# Patient Record
Sex: Female | Born: 1980 | Race: White | Hispanic: Yes | Smoking: Current some day smoker
Health system: Southern US, Community
[De-identification: ages and names within clinical notes are randomized; demographics above are authoritative.]

## PROBLEM LIST (undated history)

## (undated) DIAGNOSIS — D649 Anemia, unspecified: Secondary | ICD-10-CM

## (undated) DIAGNOSIS — M159 Polyosteoarthritis, unspecified: Secondary | ICD-10-CM

## (undated) DIAGNOSIS — D849 Immunodeficiency, unspecified: Secondary | ICD-10-CM

## (undated) DIAGNOSIS — N200 Calculus of kidney: Secondary | ICD-10-CM

## (undated) DIAGNOSIS — J45909 Unspecified asthma, uncomplicated: Secondary | ICD-10-CM

## (undated) DIAGNOSIS — K739 Chronic hepatitis, unspecified: Secondary | ICD-10-CM

## (undated) DIAGNOSIS — J40 Bronchitis, not specified as acute or chronic: Secondary | ICD-10-CM

## (undated) DIAGNOSIS — G43909 Migraine, unspecified, not intractable, without status migrainosus: Secondary | ICD-10-CM

## (undated) DIAGNOSIS — F603 Borderline personality disorder: Secondary | ICD-10-CM

## (undated) HISTORY — PX: OVARIAN CYST REMOVAL: SHX89

## (undated) HISTORY — DX: Anemia, unspecified: D64.9

## (undated) HISTORY — PX: ABDOMINAL HYSTERECTOMY: SHX81

## (undated) HISTORY — PX: LITHOTRIPSY: SUR834

---

## 2008-03-30 ENCOUNTER — Inpatient Hospital Stay (HOSPITAL_COMMUNITY): Admission: EM | Admit: 2008-03-30 | Discharge: 2008-04-01 | Payer: Self-pay | Admitting: Emergency Medicine

## 2008-06-20 ENCOUNTER — Emergency Department: Payer: Self-pay | Admitting: Emergency Medicine

## 2008-07-20 ENCOUNTER — Emergency Department: Payer: Self-pay | Admitting: Emergency Medicine

## 2010-09-28 NOTE — H&P (Signed)
Abigail Cunningham, Abigail Cunningham           ACCOUNT NO.:  [id number]   MEDICAL RECORD NO.:  [id number]          PATIENT TYPE:  INP   LOCATION:  3029                         FACILITY:  [facility]   PHYSICIAN:  Della Goo, M.D. DATE OF BIRTH:  1980-10-01   DATE OF ADMISSION:  03/29/2008  DATE OF DISCHARGE:                              HISTORY & PHYSICAL   DATE OF ADMISSION:  March 29, 2008.   PRIMARY CARE PHYSICIAN:  Unassigned.   CHIEF COMPLAINT:  Right-sided back pain, fever.   HISTORY OF PRESENT ILLNESS:  This is a 30 year old female who presents  to the emergency department with complaints of severe right-sided back  pain along with fever.  She reports her symptoms beginning 3 days ago.  She reports having nausea and dysuria.  She denies having any vomiting,  cough or congestion.  She denies having any diarrhea.   The patient was evaluated in the emergency department, had admission  laboratory studies performed and a urinalysis was performed which  revealed positive nitrites and large leukocyte esterase, along with  moderate hemoglobin.  The patient was referred for admission and started  on antibiotic therapy of ciprofloxacin and Rocephin, and IV fluids.   PAST MEDICAL HISTORY:  Significant for:  1. Nephrolithiasis.  2. Bipolar disorder.  3. Hepatitis C.  4. An ovarian cyst status post ovarian cyst excision which was      performed laparoscopically.   MEDICATIONS:  At this time none.   ALLERGIES:  TO HALDOL.   SOCIAL HISTORY:  The patient is a smoker,.  Reports smoking one pack of  cigarettes daily.  She denies any alcohol or drug usage.   FAMILY HISTORY:  Is noncontributory.   REVIEW OF SYSTEMS:  Pertinents are mentioned above.   PHYSICAL EXAMINATION:  30 year old well-nourished, well-developed female  in discomfort and acute distress initially.  VITAL SIGNS: Temperature 103.1, blood pressure 118/75, heart rate 150,  respirations 22, O2 saturations 98-99% on room  air.  HEENT EXAMINATION:  Normocephalic, atraumatic.  Pupils equally round,  reactive to light.  Extraocular movements are intact .  Funduscopic  benign.  There is no scleral icterus.  Oropharynx is clear.  NECK:  Is supple, full range of motion.  No thyromegaly, adenopathy,  jugulovenous distention.  CARDIOVASCULAR:  Tachycardiac rate and rhythm.  In sinus tachycardia on  the monitor.  LUNGS:  Clear to auscultation bilaterally.  ABDOMEN: Positive bowel sounds, soft, mildly tender in the suprapubic  along with positive right-sided costovertebral angle tenderness.  There  is no rebound, no guarding.  EXTREMITIES: Without cyanosis, clubbing or edema.  NEUROLOGIC EXAMINATION:  Nonfocal.   LABORATORY STUDIES:  White blood cell count 11.9, hemoglobin 14.5,  hematocrit 42.6, platelets 162, neutrophils 81% , lymphocytes 11%.  Sodium 138, potassium 3.6, chloride 104, bicarb 22, BUN 6, creatinine  0.9, glucose 130.  Urine HCG negative.  Urinalysis as mentioned above.  Positive moderate hemoglobin, positive urine nitrites and large  leukocyte esterase.  Chest x-ray reveals no acute disease process.   ASSESSMENT:  30 year old female being admitted with:  1. Sepsis.  2. Pyelonephritis.  3. Trichomonas vaginitis.   PLAN:  The patient will be admitted and will continue on antibiotic  therapy of IV ciprofloxacin and Rocephin along with IV fluids for fluid  resuscitation.  Flagyl therapy will be started and these findings will  be discussed with the patient and her sexual partner or partners will  need to be treated.  Further workup will ensue pending results of the  patient's clinical course and her clinical studies.      Della Goo, M.D.  Electronically Signed     HJ/MEDQ  D:  03/30/2008  T:  03/30/2008  Job:  [id number]

## 2010-09-28 NOTE — Discharge Summary (Signed)
Abigail Cunningham, Abigail Cunningham           ACCOUNT NO.:  [id number]   MEDICAL RECORD NO.:  [id number]          PATIENT TYPE:  INP   LOCATION:  3029                         FACILITY:  [facility]   PHYSICIAN:  Altha Harm, MDDATE OF BIRTH:  1980/10/13   DATE OF ADMISSION:  03/30/2008  DATE OF DISCHARGE:  04/01/2008                               DISCHARGE SUMMARY   DISCHARGE DISPOSITION:  Home.   FINAL DISCHARGE DIAGNOSES:  1. Acute pyelonephritis.  2. Escherichia coli.  3. Mild dehydration, resolved.  4. Allergic reaction to Dilaudid.   DISCHARGE MEDICATIONS:  Ciprofloxacin 500 mg p.o. b.i.d. x10 days.   CONSULTANTS:  None.   PROCEDURES:  None.   DIAGNOSTIC STUDIES:  1. Renal ultrasound done on March 30, 2008 which showed no evidence      of hydronephrosis and no focal cystic change in either kidney to      suggest intrarenal disease.  2. Chest x-ray two-view which shows no active disease.   CODE STATUS:  Full code.   ALLERGIES:  1. HALDOL.  2. HYDROMORPHONE.   CHIEF COMPLAINT:  Right-sided back pain and fever.   HISTORY OF PRESENT ILLNESS:  Please see the H&P dictated by Dr. Lovell Sheehan  for details of the HPI.   HOSPITAL COURSE:  The patient was admitted and placed on Rocephin and  ceftriaxone.  The patient was given supportive care and IV fluids.   The patient initially had very high fevers.  However, the infection was  treated and the fevers resolved.  The sensitivities to the urine culture  proved to be E-coli sensitive to ciprofloxacin, and the patient is being  sent home on oral ciprofloxacin for a total of 10 days to complete a  total of 14 days of antibiotics.   DIETARY RESTRICTIONS:  None.   PHYSICAL EXAMINATION:  None.   The patient is encouraged to increase her p.o. fluid intake, and the  patient can follow up with her primary care for physician as needed.      Altha Harm, MD  Electronically Signed     MAM/MEDQ  D:  04/01/2008  T:   04/01/2008  Job:  [id number]

## 2010-10-15 ENCOUNTER — Emergency Department: Payer: Self-pay | Admitting: Emergency Medicine

## 2011-02-16 LAB — CBC
MCHC: 34.8
MCHC: 36
MCV: 91.2
MCV: 93.8
Platelets: 162
RBC: 3.68 — ABNORMAL LOW
RBC: 3.75 — ABNORMAL LOW
RBC: 4.55
RDW: 11.9
RDW: 12.3
WBC: 11.9 — ABNORMAL HIGH
WBC: 5.9

## 2011-02-16 LAB — DIFFERENTIAL
Basophils Absolute: 0
Basophils Relative: 1
Eosinophils Absolute: 0
Lymphs Abs: 1.2
Lymphs Abs: 1.3
Monocytes Absolute: 0.8
Neutro Abs: 3.8
Neutrophils Relative %: 65

## 2011-02-16 LAB — URINE MICROSCOPIC-ADD ON

## 2011-02-16 LAB — POCT I-STAT, CHEM 8
Calcium, Ion: 1.14
Creatinine, Ser: 0.9
Glucose, Bld: 130 — ABNORMAL HIGH
Potassium: 3.6
Sodium: 138
TCO2: 22

## 2011-02-16 LAB — CULTURE, BLOOD (ROUTINE X 2): Culture: NO GROWTH

## 2011-02-16 LAB — COMPREHENSIVE METABOLIC PANEL
Albumin: 3.1 — ABNORMAL LOW
Alkaline Phosphatase: 46
BUN: 4 — ABNORMAL LOW
Calcium: 8.2 — ABNORMAL LOW
Chloride: 106
GFR calc Af Amer: >60
GFR calc non Af Amer: >60
Glucose, Bld: 138 — ABNORMAL HIGH

## 2011-02-16 LAB — URINALYSIS, ROUTINE W REFLEX MICROSCOPIC
Ketones, ur: NEGATIVE
Nitrite: POSITIVE — AB
Protein, ur: 30 — AB
Specific Gravity, Urine: 1.018

## 2011-02-16 LAB — URINE CULTURE

## 2011-02-16 LAB — POCT PREGNANCY, URINE: Preg Test, Ur: NEGATIVE

## 2011-05-14 ENCOUNTER — Emergency Department: Payer: Self-pay | Admitting: Emergency Medicine

## 2011-08-20 ENCOUNTER — Emergency Department: Payer: Self-pay | Admitting: Emergency Medicine

## 2011-09-26 ENCOUNTER — Emergency Department: Payer: Self-pay | Admitting: Emergency Medicine

## 2011-09-26 LAB — BASIC METABOLIC PANEL
Anion Gap: 5 — ABNORMAL LOW (ref 7–16)
BUN: 10 mg/dL (ref 7–18)
EGFR (Non-African Amer.): >60
Glucose: 99 mg/dL (ref 65–99)
Osmolality: 277 (ref 275–301)
Potassium: 4 mmol/L (ref 3.5–5.1)
Sodium: 139 mmol/L (ref 136–145)

## 2011-09-26 LAB — CBC: RDW: 12.6 % (ref 11.5–14.5)

## 2012-02-05 ENCOUNTER — Emergency Department: Payer: Self-pay | Admitting: Emergency Medicine

## 2012-02-15 ENCOUNTER — Emergency Department: Payer: Self-pay | Admitting: Unknown Physician Specialty

## 2012-02-16 ENCOUNTER — Emergency Department: Payer: Self-pay | Admitting: Emergency Medicine

## 2012-07-04 ENCOUNTER — Emergency Department: Payer: Self-pay | Admitting: Emergency Medicine

## 2012-07-04 LAB — CBC
HCT: 46.9 % (ref 35.0–47.0)
MCHC: 34.2 g/dL (ref 32.0–36.0)
WBC: 8.9 10*3/uL (ref 3.6–11.0)

## 2012-07-04 LAB — COMPREHENSIVE METABOLIC PANEL
BUN: 9 mg/dL (ref 7–18)
Bilirubin,Total: 0.6 mg/dL (ref 0.2–1.0)
Calcium, Total: 8.9 mg/dL (ref 8.5–10.1)
Chloride: 108 mmol/L — ABNORMAL HIGH (ref 98–107)
Co2: 26 mmol/L (ref 21–32)
Creatinine: 0.73 mg/dL (ref 0.60–1.30)
EGFR (African American): >60
Glucose: 92 mg/dL (ref 65–99)
Osmolality: 274 (ref 275–301)
Potassium: 4 mmol/L (ref 3.5–5.1)
SGOT(AST): 93 U/L — ABNORMAL HIGH (ref 15–37)

## 2012-07-04 LAB — URINALYSIS, COMPLETE
Nitrite: NEGATIVE
Ph: 6 (ref 4.5–8.0)
Protein: NEGATIVE
Squamous Epithelial: 2
WBC UR: 19 /HPF (ref 0–5)

## 2012-08-19 ENCOUNTER — Emergency Department: Payer: Self-pay | Admitting: Emergency Medicine

## 2012-08-19 LAB — CBC
HCT: 41.4 % (ref 35.0–47.0)
HGB: 14.5 g/dL (ref 12.0–16.0)
MCHC: 35 g/dL (ref 32.0–36.0)
MCV: 94 fL (ref 80–100)
Platelet: 141 10*3/uL — ABNORMAL LOW (ref 150–440)
RBC: 4.42 10*6/uL (ref 3.80–5.20)
WBC: 8.7 10*3/uL (ref 3.6–11.0)

## 2012-08-19 LAB — COMPREHENSIVE METABOLIC PANEL
Alkaline Phosphatase: 70 U/L (ref 50–136)
Anion Gap: 8 (ref 7–16)
Calcium, Total: 8.9 mg/dL (ref 8.5–10.1)
Chloride: 108 mmol/L — ABNORMAL HIGH (ref 98–107)
SGPT (ALT): 101 U/L — ABNORMAL HIGH (ref 12–78)
Total Protein: 7.9 g/dL (ref 6.4–8.2)

## 2012-08-19 LAB — URINALYSIS, COMPLETE
Bilirubin,UR: NEGATIVE
Hyaline Cast: 2
Ketone: NEGATIVE
Ph: 6 (ref 4.5–8.0)
RBC,UR: 45 /HPF (ref 0–5)
Squamous Epithelial: 42

## 2012-08-21 LAB — URINE CULTURE

## 2013-01-07 ENCOUNTER — Emergency Department: Payer: Self-pay | Admitting: Emergency Medicine

## 2013-01-07 LAB — CBC
MCHC: 35.5 g/dL (ref 32.0–36.0)
RBC: 4.6 10*6/uL (ref 3.80–5.20)
RDW: 12.5 % (ref 11.5–14.5)

## 2013-01-07 LAB — BASIC METABOLIC PANEL
Anion Gap: 6 — ABNORMAL LOW (ref 7–16)
BUN: 9 mg/dL (ref 7–18)
Chloride: 107 mmol/L (ref 98–107)
Creatinine: 0.71 mg/dL (ref 0.60–1.30)
EGFR (African American): >60
EGFR (Non-African Amer.): >60
Osmolality: 272 (ref 275–301)
Sodium: 137 mmol/L (ref 136–145)

## 2013-05-29 ENCOUNTER — Emergency Department: Payer: Self-pay | Admitting: Emergency Medicine

## 2014-04-26 ENCOUNTER — Emergency Department: Payer: Self-pay | Admitting: Internal Medicine

## 2014-10-23 ENCOUNTER — Encounter: Payer: Self-pay | Admitting: Emergency Medicine

## 2014-10-23 ENCOUNTER — Emergency Department
Admission: EM | Admit: 2014-10-23 | Discharge: 2014-10-23 | Disposition: A | Discharge status: Home / Self Care | Payer: 59 | Attending: Emergency Medicine | Admitting: Emergency Medicine

## 2014-10-23 DIAGNOSIS — M5410 Radiculopathy, site unspecified: Secondary | ICD-10-CM | POA: Diagnosis not present

## 2014-10-23 DIAGNOSIS — Z87891 Personal history of nicotine dependence: Secondary | ICD-10-CM | POA: Insufficient documentation

## 2014-10-23 DIAGNOSIS — M546 Pain in thoracic spine: Secondary | ICD-10-CM

## 2014-10-23 DIAGNOSIS — M25512 Pain in left shoulder: Secondary | ICD-10-CM | POA: Diagnosis present

## 2014-10-23 HISTORY — DX: Bronchitis, not specified as acute or chronic: J40

## 2014-10-23 HISTORY — DX: Immunodeficiency, unspecified: D84.9

## 2014-10-23 MED ORDER — HYDROCODONE-ACETAMINOPHEN 5-325 MG PO TABS: 1.0000 | ORAL_TABLET | ORAL | Status: DC | PRN

## 2014-10-23 MED ORDER — MELOXICAM 15 MG PO TABS: 15.0000 mg | ORAL_TABLET | Freq: Every day | ORAL | Status: DC

## 2014-10-23 MED ORDER — PREDNISONE 10 MG (21) PO TBPK: ORAL_TABLET | ORAL | Status: DC

## 2014-10-23 NOTE — ED Notes (Signed)
NAD noted at time of D/C. Pt denies questions or concerns. Pt ambulatory to the lobby at this time.  

## 2014-10-23 NOTE — ED Provider Notes (Signed)
[facility] Emergency Department Provider Note  ____________________________________________  Time seen: Approximately 2:48 PM  I have reviewed the triage vital signs and the nursing notes.   HISTORY  Chief Complaint Arm Pain  HPI Abigail Cunningham is a 34 y.o. female who presents to the emergency department for pain in the left scapular area. She states that the pain comes and goes but recently she believes it is causing her fingers become numb and tingly, especially at night. She has not taken any pain medications over-the-counter.   Past Medical History  Diagnosis Date  . Bronchitis unk  . Immune deficiency disorder     There are no active problems to display for this patient.   History reviewed. No pertinent past surgical history.  Current Outpatient Rx  Name  Route  Sig  Dispense  Refill  . HYDROcodone-acetaminophen (NORCO/VICODIN) 5-325 MG per tablet   Oral   Take 1 tablet by mouth every 4 (four) hours as needed for moderate pain.   12 tablet   0   . meloxicam (MOBIC) 15 MG tablet   Oral   Take 1 tablet (15 mg total) by mouth daily.   30 tablet   2   . predniSONE (STERAPRED UNI-PAK 21 TAB) 10 MG (21) TBPK tablet      Take 6 tablets on day 1 Take 5 tablets on day 2 Take 4 tablets on day 3 Take 3 tablets on day 4 Take 2 tablets on day 5 Take 1 tablet on day 6   21 tablet   0     Allergies Haldol  History reviewed. No pertinent family history.  Social History History  Substance Use Topics  . Smoking status: Former Research scientist (life sciences)  . Smokeless tobacco: Not on file  . Alcohol Use: No    Review of Systems Constitutional: No recent illness. Eyes: No visual changes. ENT: No sore throat. Cardiovascular: Denies chest pain or palpitations. Respiratory: Denies shortness of breath. Gastrointestinal: No abdominal pain.  Genitourinary: Negative for dysuria. Musculoskeletal: Pain in left scapula with radiation into the left thumb and  index finger Skin: Negative for rash. Neurological: Negative for headaches, focal weakness or numbness. 10-point ROS otherwise negative.  ____________________________________________   PHYSICAL EXAM:  VITAL SIGNS: ED Triage Vitals  Enc Vitals Group     BP 10/23/14 1311 137/93 mmHg     Pulse Rate 10/23/14 1311 78     Resp 10/23/14 1311 20     Temp 10/23/14 1311 98 F (36.7 C)     Temp Source 10/23/14 1311 Oral     SpO2 10/23/14 1311 99 %     Weight 10/23/14 1311 185 lb (83.915 kg)     Height 10/23/14 1311 5\' 3"  (1.6 m)     Head Cir --      Peak Flow --      Pain Score 10/23/14 1312 8     Pain Loc --      Pain Edu? --      Excl. in [place]? --     Constitutional: Alert and oriented. Well appearing and in no acute distress. Eyes: Conjunctivae are normal. EOMI. Head: Atraumatic. Nose: No congestion/rhinnorhea. Neck: No stridor.  Respiratory: Normal respiratory effort.   Musculoskeletal: Tenderness over left scapula. Full range of motion of left shoulder. Neurologic:  Normal speech and language. No gross focal neurologic deficits are appreciated. Speech is normal. No gait instability. Skin:  Skin is warm, dry and intact. Atraumatic. Psychiatric: Mood and affect are normal.  Speech and behavior are normal.  ____________________________________________   LABS (all labs ordered are listed, but only abnormal results are displayed)  Labs Reviewed - No data to display ____________________________________________  RADIOLOGY  Not indicated ____________________________________________   PROCEDURES  Procedure(s) performed: None   ____________________________________________   INITIAL IMPRESSION / ASSESSMENT AND PLAN / ED COURSE  Pertinent labs & imaging results that were available during my care of the patient were reviewed by me and considered in my medical decision making (see chart for details).  Patient was advised not to sleep with her arm above her head. She was  advised to follow-up with primary care or orthopedics for symptoms that are not improving with medication. She was advised to return to the emergency department for symptoms that change or worsen if she is unable schedule an appointment. ____________________________________________   FINAL CLINICAL IMPRESSION(S) / ED DIAGNOSES  Final diagnoses:  Radiculopathy affecting upper extremity  Left-sided thoracic back pain      Victorino Dike, FNP 10/23/14 1730  Lavonia Drafts, MD 10/25/14 [phone]

## 2014-10-23 NOTE — ED Notes (Signed)
States she is having pain to left shoulder for the past 3 days . Does a lot of lifting with same arm..has been seen for same and given valium,  Ran out of meds

## 2015-02-07 ENCOUNTER — Encounter: Payer: Self-pay | Admitting: *Deleted

## 2015-02-07 ENCOUNTER — Emergency Department
Admission: EM | Admit: 2015-02-07 | Discharge: 2015-02-07 | Discharge status: Against Medical Advice | Payer: 59 | Attending: Emergency Medicine | Admitting: Emergency Medicine

## 2015-02-07 DIAGNOSIS — M25511 Pain in right shoulder: Secondary | ICD-10-CM | POA: Diagnosis not present

## 2015-02-07 DIAGNOSIS — Z87891 Personal history of nicotine dependence: Secondary | ICD-10-CM | POA: Insufficient documentation

## 2015-02-07 NOTE — ED Notes (Signed)
Pt reports right shoulder pain, sudden onset when mowing tonight. Radiates from shoulder down arm.

## 2015-07-15 ENCOUNTER — Emergency Department
Admission: EM | Admit: 2015-07-15 | Discharge: 2015-07-15 | Disposition: A | Discharge status: Home / Self Care | Payer: 59 | Attending: Emergency Medicine | Admitting: Emergency Medicine

## 2015-07-15 DIAGNOSIS — R059 Cough, unspecified: Secondary | ICD-10-CM

## 2015-07-15 DIAGNOSIS — R05 Cough: Secondary | ICD-10-CM

## 2015-07-15 DIAGNOSIS — Z791 Long term (current) use of non-steroidal anti-inflammatories (NSAID): Secondary | ICD-10-CM | POA: Insufficient documentation

## 2015-07-15 DIAGNOSIS — B349 Viral infection, unspecified: Secondary | ICD-10-CM | POA: Diagnosis not present

## 2015-07-15 DIAGNOSIS — Z87891 Personal history of nicotine dependence: Secondary | ICD-10-CM | POA: Diagnosis not present

## 2015-07-15 MED ORDER — HYDROCOD POLST-CPM POLST ER 10-8 MG/5ML PO SUER
5.0000 mL | Freq: Once | ORAL | Status: AC
  Administered 2015-07-15: 5 mL via ORAL
  Filled 2015-07-15: qty 5

## 2015-07-15 MED ORDER — HYDROCOD POLST-CPM POLST ER 10-8 MG/5ML PO SUER: 5.0000 mL | Freq: Two times a day (BID) | ORAL | Status: DC | PRN

## 2015-07-15 NOTE — ED Provider Notes (Signed)
[facility] Emergency Department Provider Note  ____________________________________________   I have reviewed the triage vital signs and the nursing notes.   HISTORY  Chief Complaint Cough    HPI Abigail Cunningham is a 35 y.o. female who is healthy, no history of COPD. Is a smoker. Has had bronchitis in the past. Has an allergy to Haldol. Patient states that she has had a dry cough for 2 days, starting yesterday, as well as loose stools. Her husband had the same symptoms. He does smoke. She states she is down to one cigarette a day. She denies any vomiting or nausea. She states her whole body hurts. She wants something for pain from coughing. She denies any significant shortness of breath. She denies productive cough. She denies wheeze. Nothing makes the pain better, coughing makes it worse. She denies a melena or bright red blood per rectum. The pain is an aching pain.     Past Medical History  Diagnosis Date  . Bronchitis unk  . Immune deficiency disorder     There are no active problems to display for this patient.   Past Surgical History  Procedure Laterality Date  . Abdominal hysterectomy    . Ovarian cyst removal      Current Outpatient Rx  Name  Route  Sig  Dispense  Refill  . HYDROcodone-acetaminophen (NORCO/VICODIN) 5-325 MG per tablet   Oral   Take 1 tablet by mouth every 4 (four) hours as needed for moderate pain.   12 tablet   0   . meloxicam (MOBIC) 15 MG tablet   Oral   Take 1 tablet (15 mg total) by mouth daily.   30 tablet   2   . predniSONE (STERAPRED UNI-PAK 21 TAB) 10 MG (21) TBPK tablet      Take 6 tablets on day 1 Take 5 tablets on day 2 Take 4 tablets on day 3 Take 3 tablets on day 4 Take 2 tablets on day 5 Take 1 tablet on day 6   21 tablet   0     Allergies Haldol  No family history on file.  Social History Social History  Substance Use Topics  . Smoking status: Former Research scientist (life sciences)  . Smokeless tobacco:  Not on file  . Alcohol Use: No    Review of Systems Constitutional: No fever/chills Eyes: No visual changes. ENT: No sore throat. No stiff neck no neck pain Cardiovascular: Denies chest pain. Respiratory: Denies shortness of breath. Gastrointestinal:   no vomiting.  Positive loose stools.  No constipation. Genitourinary: Negative for dysuria. Musculoskeletal: Negative lower extremity swelling Skin: Negative for rash. Neurological: Negative for headaches, focal weakness or numbness. 10-point ROS otherwise negative.  ____________________________________________   PHYSICAL EXAM:  VITAL SIGNS: ED Triage Vitals  Enc Vitals Group     BP 07/15/15 0543 132/71 mmHg     Pulse Rate 07/15/15 0543 92     Resp 07/15/15 0543 18     Temp 07/15/15 0543 98.4 F (36.9 C)     Temp Source 07/15/15 0543 Oral     SpO2 07/15/15 0543 99 %     Weight 07/15/15 0543 175 lb (79.379 kg)     Height 07/15/15 0543 5\' 3"  (1.6 m)     Head Cir --      Peak Flow --      Pain Score 07/15/15 0543 5     Pain Loc --      Pain Edu? --  Excl. in [place]? --     Constitutional: Alert and oriented. Well appearing and in no acute distress. Eyes: Conjunctivae are normal. PERRL. EOMI. Head: Atraumatic. Nose: No congestion/rhinnorhea. Mouth/Throat: Mucous membranes are moist.  Oropharynx non-erythematous. Neck: No stridor.   Nontender with no meningismus Cardiovascular: Normal rate, regular rhythm. Grossly normal heart sounds.  Good peripheral circulation. Respiratory: Normal respiratory effort.  No retractions. Lungs CTAB. Abdominal: Soft and nontender. No distention. No guarding no rebound Back:  There is no focal tenderness or step off there is no midline tenderness there are no lesions noted. there is no CVA tenderness Musculoskeletal: No lower extremity tenderness. No joint effusions, no DVT signs strong distal pulses no edema Neurologic:  Normal speech and language. No gross focal neurologic deficits are  appreciated.  Skin:  Skin is warm, dry and intact. No rash noted. Psychiatric: Mood and affect are normal. Speech and behavior are normal.  ____________________________________________   LABS (all labs ordered are listed, but only abnormal results are displayed)  Labs Reviewed - No data to display ____________________________________________  EKG  I personally interpreted any EKGs ordered by me or triage  ____________________________________________  RADIOLOGY  I reviewed any imaging ordered by me or triage that were performed during my shift ____________________________________________   PROCEDURES  Procedure(s) performed: None  Critical Care performed: None  ____________________________________________   INITIAL IMPRESSION / ASSESSMENT AND PLAN / ED COURSE  Pertinent labs & imaging results that were available during my care of the patient were reviewed by me and considered in my medical decision making (see chart for details). very well-appearing person with a viral syndrome. No evidence at this time of pneumonia. Sats are 99-100% lungs are clearincreased respiratory work of breathing. No evidence of pneumonia. Do not that she has CAD do not think that she has any evidence of bacterial pathology. Given diarrhea, empiric antibiotics would be certainly not in her best interest. We will treat her supportively. Patient states  her biggest concern at this time as her "whole body pain and her cough. We will send her home with Tussionex which should help both. Return precautions and follow-up given and understood.  ____________________________________________   FINAL CLINICAL IMPRESSION(S) / ED DIAGNOSES  Final diagnoses:  None      This chart was dictated using voice recognition software.  Despite best efforts to proofread,  errors can occur which can change meaning.     Schuyler Amor, MD 07/15/15 [phone]

## 2015-07-15 NOTE — ED Notes (Signed)
Pt reports 2 days of "dry" cough and wheezing, pt states " Its my bronchitis flaring up". Pt in no acute distress.

## 2015-07-15 NOTE — ED Notes (Signed)
Pt given work note as requested, leaving with spouse.

## 2015-07-15 NOTE — ED Notes (Signed)
Pt in with co cough x 2 days, now having pain all over.  Denies any fever at this time hx of bronchitis, states feels the same.

## 2015-07-15 NOTE — ED Notes (Signed)
Discussed discharge instructions, prescriptions, and follow-up care with patient. No questions or concerns at this time. Pt stable at discharge.  

## 2015-07-15 NOTE — Discharge Instructions (Signed)

## 2016-04-22 ENCOUNTER — Emergency Department
Admission: EM | Admit: 2016-04-22 | Discharge: 2016-04-22 | Disposition: A | Discharge status: Home / Self Care | Payer: 59 | Attending: Emergency Medicine | Admitting: Emergency Medicine

## 2016-04-22 ENCOUNTER — Encounter: Payer: Self-pay | Admitting: Emergency Medicine

## 2016-04-22 DIAGNOSIS — R197 Diarrhea, unspecified: Secondary | ICD-10-CM | POA: Insufficient documentation

## 2016-04-22 DIAGNOSIS — F17200 Nicotine dependence, unspecified, uncomplicated: Secondary | ICD-10-CM | POA: Insufficient documentation

## 2016-04-22 DIAGNOSIS — N390 Urinary tract infection, site not specified: Secondary | ICD-10-CM

## 2016-04-22 DIAGNOSIS — R52 Pain, unspecified: Secondary | ICD-10-CM | POA: Diagnosis present

## 2016-04-22 DIAGNOSIS — R252 Cramp and spasm: Secondary | ICD-10-CM

## 2016-04-22 LAB — CBC WITH DIFFERENTIAL/PLATELET
BASOS PCT: 1 %
Basophils Absolute: 0 10*3/uL (ref 0–0.1)
EOS ABS: 0.2 10*3/uL (ref 0–0.7)
Eosinophils Relative: 4 %
HCT: 38 % (ref 35.0–47.0)
HEMOGLOBIN: 13.7 g/dL (ref 12.0–16.0)
Lymphocytes Relative: 36 %
Lymphs Abs: 2.1 10*3/uL (ref 1.0–3.6)
MCH: 32.4 pg (ref 26.0–34.0)
MCHC: 35.9 g/dL (ref 32.0–36.0)
MCV: 90.3 fL (ref 80.0–100.0)
MONOS PCT: 8 %
Monocytes Absolute: 0.5 10*3/uL (ref 0.2–0.9)
NEUTROS PCT: 51 %
Neutro Abs: 3 10*3/uL (ref 1.4–6.5)
Platelets: 139 10*3/uL — ABNORMAL LOW (ref 150–440)
RBC: 4.22 MIL/uL (ref 3.80–5.20)
RDW: 12.9 % (ref 11.5–14.5)
WBC: 5.8 10*3/uL (ref 3.6–11.0)

## 2016-04-22 LAB — URINALYSIS, ROUTINE W REFLEX MICROSCOPIC
BILIRUBIN URINE: NEGATIVE
Glucose, UA: NEGATIVE mg/dL
KETONES UR: NEGATIVE mg/dL
Nitrite: NEGATIVE
PROTEIN: NEGATIVE mg/dL
Specific Gravity, Urine: 1.006 (ref 1.005–1.030)
pH: 7 (ref 5.0–8.0)

## 2016-04-22 LAB — POCT PREGNANCY, URINE: PREG TEST UR: NEGATIVE

## 2016-04-22 LAB — COMPREHENSIVE METABOLIC PANEL
ALBUMIN: 3.9 g/dL (ref 3.5–5.0)
ALK PHOS: 60 U/L (ref 38–126)
ALT: 92 U/L — ABNORMAL HIGH (ref 14–54)
ANION GAP: 8 (ref 5–15)
AST: 64 U/L — ABNORMAL HIGH (ref 15–41)
BUN: 8 mg/dL (ref 6–20)
CALCIUM: 8.9 mg/dL (ref 8.9–10.3)
CHLORIDE: 105 mmol/L (ref 101–111)
CO2: 24 mmol/L (ref 22–32)
Creatinine, Ser: 0.7 mg/dL (ref 0.44–1.00)
GFR calc Af Amer: >60 mL/min (ref >60)
GFR calc non Af Amer: >60 mL/min (ref >60)
GLUCOSE: 121 mg/dL — AB (ref 65–99)
POTASSIUM: 3.3 mmol/L — AB (ref 3.5–5.1)
SODIUM: 137 mmol/L (ref 135–145)
Total Bilirubin: 0.3 mg/dL (ref 0.3–1.2)
Total Protein: 7.3 g/dL (ref 6.5–8.1)

## 2016-04-22 MED ORDER — CEPHALEXIN 500 MG PO CAPS: 500.0000 mg | ORAL_CAPSULE | Freq: Three times a day (TID) | ORAL | 0 refills | Status: DC

## 2016-04-22 MED ORDER — SODIUM CHLORIDE 0.9 % IV BOLUS (SEPSIS)
1000.0000 mL | Freq: Once | INTRAVENOUS | Status: AC
  Administered 2016-04-22: 1000 mL via INTRAVENOUS

## 2016-04-22 MED ORDER — KETOROLAC TROMETHAMINE 30 MG/ML IJ SOLN
30.0000 mg | Freq: Once | INTRAMUSCULAR | Status: AC
Start: 2016-04-22 — End: 2016-04-22
  Administered 2016-04-22: 30 mg via INTRAVENOUS
  Filled 2016-04-22: qty 1

## 2016-04-22 NOTE — ED Notes (Signed)
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 Pt also reports diarrhea x3 days, (+) nausea, but denies vomiting. Pt also denies any c/o CP or SHOB. Pt also with c/o feeling "achy and fell like crap".

## 2016-04-22 NOTE — ED Triage Notes (Signed)
Patient ambulatory to triage with steady gait, without difficulty or distress noted; pt reports last few days having "spasms" to left side accomp by diarrhea

## 2016-04-22 NOTE — ED Provider Notes (Signed)
[facility] Emergency Department Provider Note  Time seen: 7:13 AM  I have reviewed the triage vital signs and the nursing notes.   HISTORY  Chief Complaint No chief complaint on file.    HPI Abigail Cunningham is a 35 y.o. female who presents to the emergency department with body aches and diarrhea. According to the patient for the past 3 days she has been experiencing diarrhea several times per day. States some nausea but denies vomiting. States lack of appetite. She also states for the past several days she has been having body aches which she describes mostly in her bilateral flanks. 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 States she's had this many times before. Overall the patient appears very well, states the flank pain is minimal at this time.  Past Medical History:  Diagnosis Date  . Bronchitis unk  . Immune deficiency disorder ([place])     There are no active problems to display for this patient.   Past Surgical History:  Procedure Laterality Date  . ABDOMINAL HYSTERECTOMY    . OVARIAN CYST REMOVAL      Prior to Admission medications   Medication Sig Start Date End Date Taking? Authorizing Provider  chlorpheniramine-HYDROcodone (TUSSIONEX PENNKINETIC ER) 10-8 MG/5ML SUER Take 5 mLs by mouth every 12 (twelve) hours as needed for cough. 07/15/15   Schuyler Amor, MD  HYDROcodone-acetaminophen (NORCO/VICODIN) 5-325 MG per tablet Take 1 tablet by mouth every 4 (four) hours as needed for moderate pain. 10/23/14   Victorino Dike, FNP  meloxicam (MOBIC) 15 MG tablet Take 1 tablet (15 mg total) by mouth daily. 10/23/14   Victorino Dike, FNP  predniSONE (STERAPRED UNI-PAK 21 TAB) 10 MG (21) TBPK tablet Take 6 tablets on day 1 Take 5 tablets on day 2 Take 4 tablets on day 3 Take 3 tablets on day 4 Take 2 tablets on day 5 Take 1 tablet on day 6 10/23/14   Victorino Dike, FNP    Allergies  Allergen Reactions  . Haldol [Haloperidol Lactate]  Other (See Comments)    Shuts down 'system'    No family history on file.  Social History Social History  Substance Use Topics  . Smoking status: Current Some Day Smoker  . Smokeless tobacco: Never Used  . Alcohol use No    Review of Systems Constitutional: Negative for fever. Cardiovascular: Negative for chest pain. Respiratory: Negative for shortness of breath. Gastrointestinal: Bilateral flank pain. No abdominal pain. Positive for nausea. Negative for vomiting. Positive for diarrhea. Genitourinary: Negative for dysuria. Neurological: Negative for headache 10-point ROS otherwise negative.  ____________________________________________   PHYSICAL EXAM:  VITAL SIGNS: ED Triage Vitals [04/22/16 0639]  Enc Vitals Group     BP 125/87     Pulse Rate 76     Resp 18     Temp 98.5 F (36.9 C)     Temp Source Oral     SpO2 97 %     Weight 175 lb (79.4 kg)     Height 5\' 3"  (1.6 m)     Head Circumference      Peak Flow      Pain Score 4     Pain Loc      Pain Edu?      Excl. in [place]?     Constitutional: Alert and oriented. Well appearing and in no distress. Eyes: Normal exam ENT   Head: Normocephalic and atraumatic   Mouth/Throat: Mucous membranes  are moist. Cardiovascular: Normal rate, regular rhythm. No murmur Respiratory: Normal respiratory effort without tachypnea nor retractions. Breath sounds are clear Gastrointestinal: Soft and nontender. No distention.  Musculoskeletal: Nontender with normal range of motion in all extremities. Neurologic:  Normal speech and language. No gross focal neurologic deficits Skin:  Skin is warm, dry and intact.  Psychiatric: Mood and affect are normal.   ____________________________________________    INITIAL IMPRESSION / ASSESSMENT AND PLAN / ED COURSE  Pertinent labs & imaging results that were available during my care of the patient were reviewed by me and considered in my medical decision making (see chart for  details).  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 She is a nontender abdominal exam. No CVA tenderness. Patient also states 3 days of diarrhea, several episodes per day. Denies fever. Denies vomiting. We will check basic labs, IV hydrate and treat with a IV dose of Toradol. Patient agreeable to plan. Overall the patient appears very well.  Blood work is largely within normal limits including negative/normal white blood cell count. Urinalysis consistent with urinary tract infection. Urine culture has been added on. We will cover with Keflex. Patient will be discharge with PCP follow-up.  ____________________________________________   FINAL CLINICAL IMPRESSION(S) / ED DIAGNOSES  Muscle cramps Diarrhea UTI    Harvest Dark, MD 04/22/16 [phone]

## 2016-04-23 LAB — URINE CULTURE

## 2016-09-13 ENCOUNTER — Emergency Department
Admission: EM | Admit: 2016-09-13 | Discharge: 2016-09-13 | Disposition: A | Discharge status: Home / Self Care | Payer: 59 | Attending: Emergency Medicine | Admitting: Emergency Medicine

## 2016-09-13 ENCOUNTER — Emergency Department: Payer: 59

## 2016-09-13 ENCOUNTER — Encounter: Payer: Self-pay | Admitting: Emergency Medicine

## 2016-09-13 ENCOUNTER — Telehealth: Payer: Self-pay | Admitting: Emergency Medicine

## 2016-09-13 DIAGNOSIS — F17200 Nicotine dependence, unspecified, uncomplicated: Secondary | ICD-10-CM | POA: Insufficient documentation

## 2016-09-13 DIAGNOSIS — N83292 Other ovarian cyst, left side: Secondary | ICD-10-CM | POA: Diagnosis not present

## 2016-09-13 DIAGNOSIS — N3000 Acute cystitis without hematuria: Secondary | ICD-10-CM | POA: Diagnosis not present

## 2016-09-13 DIAGNOSIS — Z79899 Other long term (current) drug therapy: Secondary | ICD-10-CM | POA: Insufficient documentation

## 2016-09-13 DIAGNOSIS — R1031 Right lower quadrant pain: Secondary | ICD-10-CM | POA: Diagnosis present

## 2016-09-13 DIAGNOSIS — J45909 Unspecified asthma, uncomplicated: Secondary | ICD-10-CM | POA: Diagnosis not present

## 2016-09-13 DIAGNOSIS — N83202 Unspecified ovarian cyst, left side: Secondary | ICD-10-CM

## 2016-09-13 DIAGNOSIS — R1032 Left lower quadrant pain: Secondary | ICD-10-CM

## 2016-09-13 HISTORY — DX: Chronic hepatitis, unspecified: K73.9

## 2016-09-13 HISTORY — DX: Unspecified asthma, uncomplicated: J45.909

## 2016-09-13 LAB — COMPREHENSIVE METABOLIC PANEL
ALT: 71 U/L — AB (ref 14–54)
AST: 80 U/L — AB (ref 15–41)
Albumin: 4.1 g/dL (ref 3.5–5.0)
Alkaline Phosphatase: 67 U/L (ref 38–126)
Anion gap: 7 (ref 5–15)
BUN: 10 mg/dL (ref 6–20)
CHLORIDE: 106 mmol/L (ref 101–111)
CO2: 23 mmol/L (ref 22–32)
CREATININE: 0.68 mg/dL (ref 0.44–1.00)
Calcium: 9 mg/dL (ref 8.9–10.3)
GFR calc Af Amer: >60 mL/min (ref >60)
GFR calc non Af Amer: >60 mL/min (ref >60)
Glucose, Bld: 144 mg/dL — ABNORMAL HIGH (ref 65–99)
Potassium: 3.6 mmol/L (ref 3.5–5.1)
SODIUM: 136 mmol/L (ref 135–145)
Total Bilirubin: 0.6 mg/dL (ref 0.3–1.2)
Total Protein: 7.7 g/dL (ref 6.5–8.1)

## 2016-09-13 LAB — URINALYSIS, COMPLETE (UACMP) WITH MICROSCOPIC
BILIRUBIN URINE: NEGATIVE
Glucose, UA: NEGATIVE mg/dL
Ketones, ur: NEGATIVE mg/dL
Nitrite: NEGATIVE
Protein, ur: 30 mg/dL — AB
SPECIFIC GRAVITY, URINE: 1.021 (ref 1.005–1.030)
pH: 5 (ref 5.0–8.0)

## 2016-09-13 LAB — CBC
HEMATOCRIT: 43.4 % (ref 35.0–47.0)
Hemoglobin: 14.6 g/dL (ref 12.0–16.0)
MCH: 30.4 pg (ref 26.0–34.0)
MCHC: 33.7 g/dL (ref 32.0–36.0)
MCV: 90.2 fL (ref 80.0–100.0)
PLATELETS: 129 10*3/uL — AB (ref 150–440)
RBC: 4.81 MIL/uL (ref 3.80–5.20)
RDW: 14 % (ref 11.5–14.5)
WBC: 7 10*3/uL (ref 3.6–11.0)

## 2016-09-13 LAB — POCT PREGNANCY, URINE: PREG TEST UR: NEGATIVE

## 2016-09-13 LAB — LIPASE, BLOOD: LIPASE: 32 U/L (ref 11–51)

## 2016-09-13 MED ORDER — CEFTRIAXONE SODIUM-DEXTROSE 1-3.74 GM-% IV SOLR
1.0000 g | Freq: Once | INTRAVENOUS | Status: DC
  Filled 2016-09-13: qty 50

## 2016-09-13 MED ORDER — KETOROLAC TROMETHAMINE 30 MG/ML IJ SOLN
30.0000 mg | Freq: Once | INTRAMUSCULAR | Status: AC
  Administered 2016-09-13: 30 mg via INTRAVENOUS
  Filled 2016-09-13: qty 1

## 2016-09-13 MED ORDER — CEPHALEXIN 500 MG PO CAPS: 500.0000 mg | ORAL_CAPSULE | Freq: Four times a day (QID) | ORAL | 0 refills | Status: AC

## 2016-09-13 MED ORDER — ACETAMINOPHEN 500 MG PO TABS
1000.0000 mg | ORAL_TABLET | Freq: Once | ORAL | Status: DC
Start: 2016-09-13 — End: 2016-09-13
  Filled 2016-09-13: qty 2

## 2016-09-13 MED ORDER — SENNOSIDES-DOCUSATE SODIUM 8.6-50 MG PO TABS: 2.0000 | ORAL_TABLET | Freq: Every day | ORAL | 0 refills | Status: AC | PRN

## 2016-09-13 MED ORDER — FENTANYL CITRATE (PF) 100 MCG/2ML IJ SOLN
50.0000 ug | Freq: Once | INTRAMUSCULAR | Status: AC
  Administered 2016-09-13: 50 ug via INTRAVENOUS
  Filled 2016-09-13: qty 2

## 2016-09-13 MED ORDER — ONDANSETRON 4 MG PO TBDP: 4.0000 mg | ORAL_TABLET | Freq: Three times a day (TID) | ORAL | 0 refills | Status: DC | PRN

## 2016-09-13 MED ORDER — DEXTROSE 5 % IV SOLN: 1.0000 g | Freq: Once | INTRAVENOUS | Status: DC

## 2016-09-13 MED ORDER — SODIUM CHLORIDE 0.9 % IV BOLUS (SEPSIS)
1000.0000 mL | Freq: Once | INTRAVENOUS | Status: AC
  Administered 2016-09-13: 1000 mL via INTRAVENOUS

## 2016-09-13 MED ORDER — DEXTROSE 5 % IV SOLN
1.0000 g | Freq: Once | INTRAVENOUS | Status: AC
  Administered 2016-09-13: 1 g via INTRAVENOUS

## 2016-09-13 MED ORDER — IOPAMIDOL (ISOVUE-300) INJECTION 61%
30.0000 mL | Freq: Once | INTRAVENOUS | Status: AC | PRN
  Administered 2016-09-13: 30 mL via ORAL

## 2016-09-13 MED ORDER — IOPAMIDOL (ISOVUE-300) INJECTION 61%
100.0000 mL | Freq: Once | INTRAVENOUS | Status: AC | PRN
  Administered 2016-09-13: 100 mL via INTRAVENOUS

## 2016-09-13 NOTE — ED Notes (Signed)
Waiting on [place] results. NAD no needs.

## 2016-09-13 NOTE — Discharge Instructions (Signed)
Please drink plenty of fluid to stay well-hydrated. Please complete the entire course of antibiotics for your urinary tract infection.  You may take Zofran for nausea. Please take a clear liquid diet for the next 24-48 hours, then advance to a bland diet as tolerated. Please make sure your eating a high-fiber diet, drinking plenty of fluids, and staying active to prevent constipation. You may take a stool softener for constipation.   Make a follow-up appointment with your primary care physician to have your symptoms reevaluated. Please make an appointment with Dr. Ouida Sills, the gynecologist on call, to have your ovarian cyst re-evaluated.  Return to the emergency department if you develop severe pain, fever, inability to keep down fluids, lightheadedness or fainting, or any other symptoms concerning to you.

## 2016-09-13 NOTE — ED Notes (Signed)
Pt returned from U/S via stretcher. 

## 2016-09-13 NOTE — ED Notes (Signed)
Patient transported to Ultrasound 

## 2016-09-13 NOTE — ED Triage Notes (Addendum)
Pt c/o suprapubic/lower mid abdominal pain that started this AM. Has had nausea and vomiting as well.  Has been sweaty per pt but no fevers. Blood sugar WNL for EMS. Fentanyl and zofran by EMS. Pain worst when trying to have bowel movement

## 2016-09-13 NOTE — ED Provider Notes (Signed)
[facility] Emergency Department Provider Note  ____________________________________________  Time seen: Approximately 9:01 AM  I have reviewed the triage vital signs and the nursing notes.   HISTORY  Chief Complaint Abdominal Pain    HPI Abigail Cunningham is a 36 y.o. female s/p abd hysterectomy remotely, chronic hepatitis, and immune deficiency d/o presenting for abdominal pain, constipation and straining, rectal pain, diaphoresis and presyncope. The patient reports that she has been feeling poorly for the last several dayswith generalized malaise and nausea. She chronically has to strain for bowel movements, and this morning was straining to have a bowel movement and developed associated rectal and suprapubic pain. This resulted in diaphoresis, lightheadedness, and several episodes of vomiting clear mucus. She is not had any urinary frequency or dysuria, fever or chills, change in vaginal discharge, bright red blood per rectum or melena. She was given Zofran by EMS, and her nausea has completely resolved at this time   Past Medical History:  Diagnosis Date  . Asthma   . Bronchitis unk  . Chronic hepatitis ([place])   . Immune deficiency disorder ([place])     There are no active problems to display for this patient.   Past Surgical History:  Procedure Laterality Date  . ABDOMINAL HYSTERECTOMY    . OVARIAN CYST REMOVAL      Current Outpatient Rx  . Order #: [id number] Class: Historical Med  . Order #: [id number] Class: Print  . Order #: [id number] Class: Print  . Order #: [id number] Class: Print  . Order #: [id number] Class: Print  . Order #: [id number] Class: Print    Allergies Haldol [haloperidol lactate]  History reviewed. No pertinent family history.  Social History Social History  Substance Use Topics  . Smoking status: Current Some Day Smoker  . Smokeless tobacco: Never Used  . Alcohol use No    Review of Systems Constitutional: No  fever/chills.Positive lightheadedness without syncope. Positive generalized malaise. Positive diaphoresis. Eyes: No visual changes. ENT: No sore throat. No congestion or rhinorrhea. Cardiovascular: Denies chest pain. Denies palpitations. Respiratory: Denies shortness of breath.  No cough. Gastrointestinal: Positive suprapubic abdominal pain.  + nausea, + vomiting.  No diarrhea.  + constipation. Genitourinary: Negative for dysuria. No urinary frequency. Positive rectal pain. Musculoskeletal: Negative for back pain. Skin: Negative for rash. Neurological: Negative for headaches. No focal numbness, tingling or weakness.   10-point ROS otherwise negative.  ____________________________________________   PHYSICAL EXAM:  VITAL SIGNS: ED Triage Vitals  Enc Vitals Group     BP 09/13/16 0832 123/81     Pulse Rate 09/13/16 0830 (!) 51     Resp 09/13/16 0830 20     Temp 09/13/16 0830 97.9 F (36.6 C)     Temp Source 09/13/16 0830 Oral     SpO2 09/13/16 0830 98 %     Weight 09/13/16 0830 192 lb (87.1 kg)     Height 09/13/16 0830 5\' 2"  (1.575 m)     Head Circumference --      Peak Flow --      Pain Score 09/13/16 0830 5     Pain Loc --      Pain Edu? --      Excl. in [place]? --     Constitutional: Alert and oriented. Well appearing and in no acute distress. Answers questions appropriately. Eyes: Conjunctivae are normal.  EOMI. No scleral icterus. Head: Atraumatic. Nose: No congestion/rhinnorhea. Mouth/Throat: Mucous membranes are moist. Poor dentition diffusely. Neck: No stridor.  Supple.  No meningismus. Cardiovascular: Normal rate,  regular rhythm. No murmurs, rubs or gallops.  Respiratory: Normal respiratory effort.  No accessory muscle use or retractions. Lungs CTAB.  No wheezes, rales or ronchi. Gastrointestinal: Soft, and nondistended.  Tender to palpation in the right greater than the left lower quadrant. No guarding or rebound.  No peritoneal signs. Genitourinary: No visualized  external hemorrhoids or palpable internal hemorrhoids. The patient has brown stool. No pain on rectal examination. Musculoskeletal: No LE edema.  Neurologic:  A&Ox3.  Speech is clear.  Face and smile are symmetric.  EOMI.  Moves all extremities well. Skin:  Skin is warm, dry and intact. No rash noted. Psychiatric: Mood and affect are normal. Speech and behavior are normal.  Normal judgement.  ____________________________________________   LABS (all labs ordered are listed, but only abnormal results are displayed)  Labs Reviewed  COMPREHENSIVE METABOLIC PANEL - Abnormal; Notable for the following:       Result Value   Glucose, Bld 144 (*)    AST 80 (*)    ALT 71 (*)    All other components within normal limits  CBC - Abnormal; Notable for the following:    Platelets 129 (*)    All other components within normal limits  URINALYSIS, COMPLETE (UACMP) WITH MICROSCOPIC - Abnormal; Notable for the following:    Color, Urine YELLOW (*)    APPearance HAZY (*)    Hgb urine dipstick SMALL (*)    Protein, ur 30 (*)    Leukocytes, UA TRACE (*)    Bacteria, UA RARE (*)    Squamous Epithelial / LPF 6-30 (*)    All other components within normal limits  LIPASE, BLOOD  POC URINE PREG, ED  POCT PREGNANCY, URINE   ____________________________________________  EKG  Not indicated ____________________________________________  RADIOLOGY  US Transvaginal Non-ob  Result Date: 09/13/2016 CLINICAL DATA:  Left lower quadrant pain for 1 day. Left ovarian cystic lesion on recent CT . EXAM: TRANSABDOMINAL AND TRANSVAGINAL ULTRASOUND OF PELVIS TECHNIQUE: Both transabdominal and transvaginal ultrasound examinations of the pelvis were performed. Transabdominal technique was performed for global imaging of the pelvis including uterus, ovaries, adnexal regions, and pelvic cul-de-sac. It was necessary to proceed with endovaginal exam following the transabdominal exam to visualize the ovaries and left adnexal  cystic lesion. COMPARISON:  CT 09/13/2016 and 07/04/2012 FINDINGS: Uterus Measurements: 8.7 x 4.4 x 5.6 cm. No fibroids or other mass visualized. Endometrium Thickness: 11 mm.  No focal abnormality visualized. Right ovary Measurements: 3.4 x 2.4 x 2.5 cm. Normal appearance/no adnexal mass. Left ovary Measurements: 6.1 x 4.0 x 5.9 cm. A complex cystic lesion containing several septations, some showing mild thickening and irregularity. There also several tiny mural nodular densities. This lesion measures 5.7 x 4.0 x 3.6 cm, and is new or increased in size compared to previous CT in 2014. No internal blood flow seen within this lesion, although blood flow is seen in surrounding normal ovarian tissue on color Doppler ultrasound. This lesion has indeterminate characteristics, and differential diagnosis includes cystic ovarian neoplasm, endometrioma, and atypical hemorrhagic cyst. Other findings No abnormal free fluid. IMPRESSION: 5.7 cm complex cystic lesion in left ovary. Differential diagnosis includes cystic ovarian neoplasm, endometrioma, and atypical hemorrhagic cyst. Recommend correlation with CA 125 level, and consider surgical evaluation versus followup transvaginal ultrasound in 6-12 weeks. Normal appearance of uterus and right ovary . Electronically Signed   By: Earle Gell M.D.   On: 09/13/2016 12:35   US Pelvis Complete  Result Date: 09/13/2016 CLINICAL DATA:  Left  lower quadrant pain for 1 day. Left ovarian cystic lesion on recent CT . EXAM: TRANSABDOMINAL AND TRANSVAGINAL ULTRASOUND OF PELVIS TECHNIQUE: Both transabdominal and transvaginal ultrasound examinations of the pelvis were performed. Transabdominal technique was performed for global imaging of the pelvis including uterus, ovaries, adnexal regions, and pelvic cul-de-sac. It was necessary to proceed with endovaginal exam following the transabdominal exam to visualize the ovaries and left adnexal cystic lesion. COMPARISON:  CT 09/13/2016 and  07/04/2012 FINDINGS: Uterus Measurements: 8.7 x 4.4 x 5.6 cm. No fibroids or other mass visualized. Endometrium Thickness: 11 mm.  No focal abnormality visualized. Right ovary Measurements: 3.4 x 2.4 x 2.5 cm. Normal appearance/no adnexal mass. Left ovary Measurements: 6.1 x 4.0 x 5.9 cm. A complex cystic lesion containing several septations, some showing mild thickening and irregularity. There also several tiny mural nodular densities. This lesion measures 5.7 x 4.0 x 3.6 cm, and is new or increased in size compared to previous CT in 2014. No internal blood flow seen within this lesion, although blood flow is seen in surrounding normal ovarian tissue on color Doppler ultrasound. This lesion has indeterminate characteristics, and differential diagnosis includes cystic ovarian neoplasm, endometrioma, and atypical hemorrhagic cyst. Other findings No abnormal free fluid. IMPRESSION: 5.7 cm complex cystic lesion in left ovary. Differential diagnosis includes cystic ovarian neoplasm, endometrioma, and atypical hemorrhagic cyst. Recommend correlation with CA 125 level, and consider surgical evaluation versus followup transvaginal ultrasound in 6-12 weeks. Normal appearance of uterus and right ovary . Electronically Signed   By: Earle Gell M.D.   On: 09/13/2016 12:35   Ct Abdomen Pelvis W Contrast  Result Date: 09/13/2016 CLINICAL DATA:  Lower abdominal pain, right greater than left. EXAM: CT ABDOMEN AND PELVIS WITH CONTRAST TECHNIQUE: Multidetector CT imaging of the abdomen and pelvis was performed using the standard protocol following bolus administration of intravenous contrast. CONTRAST:  111mL ISOVUE-300 IOPAMIDOL (ISOVUE-300) INJECTION 61% COMPARISON:  07/04/2012 FINDINGS: Lower chest: Lung bases are clear. No effusions. Heart is normal size. Hepatobiliary: Nodular contours of the liver compatible with cirrhosis. No focal hepatic abnormality. Gallbladder unremarkable. Pancreas: No focal abnormality or ductal  dilatation. Spleen: Spleen upper limits normal in size. No focal splenic abnormality. Adrenals/Urinary Tract: Normal adrenal glands. Small cysts in the kidneys bilaterally. No hydronephrosis. Urinary bladder unremarkable. Stomach/Bowel: Normal appendix. Stomach, large and small bowel grossly unremarkable. Vascular/Lymphatic: No evidence of aortic aneurysm. Borderline size retroperitoneal lymph nodes. Celiac axis lymph node on image 26 measures 10 mm in short axis diameter. Left periaortic lymph node measures 9 mm. Reproductive: Uterus and right ovary unremarkable. 5 cm cyst in the left ovary with probable internal septations and calcification. Other:  Small amount of free fluid in the pelvis.  No free air. Musculoskeletal: No acute bony abnormality. IMPRESSION: Nodular contours of the liver compatible with cirrhosis. Spleen upper limits normal in size. Small amount of free fluid in the pelvis. Somewhat complex appearing left ovarian cyst, 5 cm. This could be further characterized with pelvic ultrasound. Normal appendix. Electronically Signed   By: Rolm Baptise M.D.   On: 09/13/2016 10:34    ____________________________________________   PROCEDURES  Procedure(s) performed: None  Procedures  Critical Care performed: No ____________________________________________   INITIAL IMPRESSION / ASSESSMENT AND PLAN / ED COURSE  Pertinent labs & imaging results that were available during my care of the patient were reviewed by me and considered in my medical decision making (see chart for details).  36 y.o. female presenting with right greater than left lower  abdominal pain associated with constipation and rectal pain, and an episode of diaphoresis and lightheadedness. Overall, it is possible that the patient had a vagal episode while having a bowel movement this morning. However, she does have tenderness on her abdomen in my examination, so we'll get a CT scan to evaluate for appendicitis or diverticulitis,  early or partial small bowel obstruction is less likely given that she is still having bowel movements. If her workup is reassuring, I'll plan to discharge her home with instructions for high fiber diet and with a stool softener. Plan reevaluation for final disposition.  ----------------------------------------- 9:21 AM on 09/13/2016 -----------------------------------------  The patient does have some squamous cells in her urine sample, but she is positive for bacteria, white blood cells, and leukocyte esterase I will treat her with a gram of Rocephin while we're awaiting her CT results.  ----------------------------------------- 10:56 AM on 09/13/2016 -----------------------------------------  The patient CT results are reassuring. She does have a complex left-sided ovarian cyst and some minimal discomfort on my examination on that side, also will further evaluate with a pelvic ultrasound. Clinically, the patient is feeling significantly better, her pain has improved and her nausea has resolved. She has been able to tolerate clear liquids without vomiting. Plan reevaluation for final disposition.  ----------------------------------------- 12:44 PM on 09/13/2016 -----------------------------------------  The patient has an ultrasound which shows a complex left cyst with a wide differential, including neoplasm. I have told the patient about this, and spoke with Dr. Ouida Sills, the gynecologist on-call, who will see the patient in the outpatient clinic for reevaluation. At this time, the patient is stable for discharge. Return precautions as well as follow-up instructions were discussed.  ____________________________________________  FINAL CLINICAL IMPRESSION(S) / ED DIAGNOSES  Final diagnoses:  Acute cystitis without hematuria  Left ovarian cyst  Complex cyst of left ovary         NEW MEDICATIONS STARTED DURING THIS VISIT:  New Prescriptions   CEPHALEXIN (KEFLEX) 500 MG CAPSULE     Take 1 capsule (500 mg total) by mouth 4 (four) times daily.   ONDANSETRON (ZOFRAN ODT) 4 MG DISINTEGRATING TABLET    Take 1 tablet (4 mg total) by mouth every 8 (eight) hours as needed for nausea or vomiting.   SENNA-DOCUSATE (SENOKOT-S) 8.6-50 MG TABLET    Take 2 tablets by mouth daily as needed for mild constipation.      Eula Listen, MD 09/13/16 1245

## 2016-09-14 ENCOUNTER — Telehealth: Payer: Self-pay | Admitting: Obstetrics and Gynecology

## 2016-09-14 NOTE — Telephone Encounter (Signed)
Pt was advised to see AMS for f/u appt.

## 2016-09-14 NOTE — Telephone Encounter (Signed)
Please advise ER follow up from 09/13/16. Pt refused first available appt with Alicia Copland for 05/17/22.

## 2016-09-14 NOTE — Telephone Encounter (Signed)
May follow up with any MD in the next 1-2 weeks

## 2016-09-15 NOTE — Telephone Encounter (Signed)
Left msg for pt to call back and schedule with any MD.

## 2016-12-22 ENCOUNTER — Emergency Department: Admission: EM | Admit: 2016-12-22 | Discharge: 2016-12-22 | Discharge status: Against Medical Advice | Payer: 59

## 2016-12-22 NOTE — ED Notes (Signed)
Pt called to triage without answer.  

## 2017-02-07 ENCOUNTER — Encounter: Payer: Self-pay | Admitting: Medical Oncology

## 2017-02-07 ENCOUNTER — Emergency Department
Admission: EM | Admit: 2017-02-07 | Discharge: 2017-02-07 | Disposition: A | Discharge status: Home / Self Care | Payer: 59 | Attending: Emergency Medicine | Admitting: Emergency Medicine

## 2017-02-07 DIAGNOSIS — J45909 Unspecified asthma, uncomplicated: Secondary | ICD-10-CM | POA: Diagnosis not present

## 2017-02-07 DIAGNOSIS — Z79899 Other long term (current) drug therapy: Secondary | ICD-10-CM | POA: Diagnosis not present

## 2017-02-07 DIAGNOSIS — F17200 Nicotine dependence, unspecified, uncomplicated: Secondary | ICD-10-CM | POA: Diagnosis not present

## 2017-02-07 DIAGNOSIS — R197 Diarrhea, unspecified: Secondary | ICD-10-CM | POA: Diagnosis present

## 2017-02-07 DIAGNOSIS — B349 Viral infection, unspecified: Secondary | ICD-10-CM | POA: Diagnosis not present

## 2017-02-07 DIAGNOSIS — J069 Acute upper respiratory infection, unspecified: Secondary | ICD-10-CM | POA: Diagnosis not present

## 2017-02-07 DIAGNOSIS — N3090 Cystitis, unspecified without hematuria: Secondary | ICD-10-CM | POA: Insufficient documentation

## 2017-02-07 LAB — URINALYSIS, COMPLETE (UACMP) WITH MICROSCOPIC
Bacteria, UA: NONE SEEN
Bilirubin Urine: NEGATIVE
GLUCOSE, UA: NEGATIVE mg/dL
Hgb urine dipstick: NEGATIVE
Ketones, ur: NEGATIVE mg/dL
Nitrite: NEGATIVE
PH: 6 (ref 5.0–8.0)
PROTEIN: NEGATIVE mg/dL
SPECIFIC GRAVITY, URINE: 1.015 (ref 1.005–1.030)

## 2017-02-07 MED ORDER — SULFAMETHOXAZOLE-TRIMETHOPRIM 800-160 MG PO TABS: 1.0000 | ORAL_TABLET | Freq: Two times a day (BID) | ORAL | 0 refills | Status: DC

## 2017-02-07 MED ORDER — BENZONATATE 100 MG PO CAPS: 100.0000 mg | ORAL_CAPSULE | Freq: Three times a day (TID) | ORAL | 0 refills | Status: AC | PRN

## 2017-02-07 MED ORDER — FLUTICASONE PROPIONATE 50 MCG/ACT NA SUSP: 2.0000 | Freq: Every day | NASAL | 0 refills | Status: DC

## 2017-02-07 NOTE — ED Triage Notes (Signed)
Pt reports cold sx's with cough and congestion and also has been having body aches with some lower abd cramping.

## 2017-02-07 NOTE — ED Notes (Signed)
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

## 2017-02-07 NOTE — ED Provider Notes (Signed)
[facility] Emergency Department Provider Note  ____________________________________________  Time seen: Approximately 10:02 AM  I have reviewed the triage vital signs and the nursing notes.   HISTORY  Chief Complaint Generalized Body Aches and Diarrhea    HPI Abigail Cunningham is a 36 y.o. female presents to the emergency department with 3 days of nasal congestion, nonproductive cough, body aches, diarrhea. Patient she has had some lower abdominal "bubbling" that only happens when she feels like she is about out of bowel movement. She describes the diarrhea as "pudding consistency." No blood or mucus. She has had diarrhea once this morning and 3 times yesterday. She is eating and drinking well. She has had about 2 months of urinary urgency. She states that when she is at work, she has to run to the bathroom. No dysuria or frequency.She is primarily here for work note. No fever, shortness of breath, chest pain, nausea, vomiting.    Past Medical History:  Diagnosis Date  . Asthma   . Bronchitis unk  . Chronic hepatitis ([place])   . Immune deficiency disorder ([place])     There are no active problems to display for this patient.   Past Surgical History:  Procedure Laterality Date  . ABDOMINAL HYSTERECTOMY    . OVARIAN CYST REMOVAL      Prior to Admission medications   Medication Sig Start Date End Date Taking? Authorizing Provider  benzonatate (TESSALON PERLES) 100 MG capsule Take 1 capsule (100 mg total) by mouth 3 (three) times daily as needed for cough. 02/07/17 02/07/18  Laban Emperor, PA-C  fluticasone (FLONASE) 50 MCG/ACT nasal spray Place 2 sprays into both nostrils daily. 02/07/17 02/07/18  Laban Emperor, PA-C  HYDROcodone-acetaminophen (NORCO/VICODIN) 5-325 MG per tablet Take 1 tablet by mouth every 4 (four) hours as needed for moderate pain. Patient not taking: Reported on 04/22/2016 10/23/14   Sherrie George B, FNP  lamoTRIgine (LAMICTAL) 25 MG  tablet Take 50 mg by mouth 2 (two) times daily. Take 25 mg daily week 1; 25 mg twice daily week 2; 50 mg in the morning and 25 mg at night week 3; 50 mg twice daily week 4. 08/18/16   [provider]  meloxicam (MOBIC) 15 MG tablet Take 1 tablet (15 mg total) by mouth daily. Patient not taking: Reported on 04/22/2016 10/23/14   Sherrie George B, FNP  ondansetron (ZOFRAN ODT) 4 MG disintegrating tablet Take 1 tablet (4 mg total) by mouth every 8 (eight) hours as needed for nausea or vomiting. 09/13/16   Eula Listen, MD  senna-docusate (SENOKOT-S) 8.6-50 MG tablet Take 2 tablets by mouth daily as needed for mild constipation. 09/13/16 09/13/17  Eula Listen, MD  sulfamethoxazole-trimethoprim (BACTRIM DS,SEPTRA DS) 800-160 MG tablet Take 1 tablet by mouth 2 (two) times daily. 02/07/17   Laban Emperor, PA-C    Allergies Haldol [haloperidol lactate]  No family history on file.  Social History Social History  Substance Use Topics  . Smoking status: Current Some Day Smoker  . Smokeless tobacco: Never Used  . Alcohol use No     Review of Systems  Constitutional: No fever/chills Cardiovascular: No chest pain. Respiratory: No SOB. Gastrointestinal: No nausea, no vomiting.  Skin: Negative for rash, abrasions, lacerations, ecchymosis. Neurological: Negative for headaches, numbness or tingling   ____________________________________________   PHYSICAL EXAM:  VITAL SIGNS: ED Triage Vitals  Enc Vitals Group     BP 02/07/17 0907 136/78     Pulse Rate 02/07/17 0905 84  Resp 02/07/17 0905 18     Temp 02/07/17 0905 98.2 F (36.8 C)     Temp Source 02/07/17 0905 Oral     SpO2 02/07/17 0905 100 %     Weight 02/07/17 0905 182 lb (82.6 kg)     Height 02/07/17 0905 5\' 3"  (1.6 m)     Head Circumference --      Peak Flow --      Pain Score 02/07/17 0904 5     Pain Loc --      Pain Edu? --      Excl. in [place]? --      Constitutional: Alert and oriented. Well appearing  and in no acute distress. Eyes: Conjunctivae are normal. PERRL. EOMI. Head: Atraumatic. ENT:      Ears:      Nose: No congestion/rhinnorhea.      Mouth/Throat: Mucous membranes are moist.  Neck: No stridor.   Cardiovascular: Normal rate, regular rhythm.  Good peripheral circulation. Respiratory: Normal respiratory effort without tachypnea or retractions. Lungs CTAB. Good air entry to the bases with no decreased or absent breath sounds. Gastrointestinal: Bowel sounds 4 quadrants. Suprapubic tenderness to palpation. No guarding or rigidity. No palpable masses. No distention. No CVA tenderness. Musculoskeletal: Full range of motion to all extremities. No gross deformities appreciated. Neurologic:  Normal speech and language. No gross focal neurologic deficits are appreciated.  Skin:  Skin is warm, dry and intact. No rash noted.   ____________________________________________   LABS (all labs ordered are listed, but only abnormal results are displayed)  Labs Reviewed  URINALYSIS, COMPLETE (UACMP) WITH MICROSCOPIC - Abnormal; Notable for the following:       Result Value   Color, Urine YELLOW (*)    APPearance CLOUDY (*)    Leukocytes, UA LARGE (*)    Squamous Epithelial / LPF 6-30 (*)    All other components within normal limits   ____________________________________________  EKG   ____________________________________________  RADIOLOGY   No results found.  ____________________________________________    PROCEDURES  Procedure(s) performed:    Procedures    Medications - No data to display   ____________________________________________   INITIAL IMPRESSION / ASSESSMENT AND PLAN / ED COURSE  Pertinent labs & imaging results that were available during my care of the patient were reviewed by me and considered in my medical decision making (see chart for details).  Review of the [place] CSRS was performed in accordance of the [place] prior to dispensing any controlled  drugs.  Patient's diagnosis is consistent with cystitis and upper respiratory infection. Vital signs and exam are reassuring. Urinalysis consistent with infection. Patient appears well and would like a note for work. Patient will be discharged home with prescriptions for Bactrim, tessalon Perles, and flonase. Patient is to follow up with PCP as directed. Patient is given ED precautions to return to the ED for any worsening or new symptoms.     ____________________________________________  FINAL CLINICAL IMPRESSION(S) / ED DIAGNOSES  Final diagnoses:  Cystitis  Viral illness      NEW MEDICATIONS STARTED DURING THIS VISIT:  Discharge Medication List as of 02/07/2017 10:10 AM    START taking these medications   Details  benzonatate (TESSALON PERLES) 100 MG capsule Take 1 capsule (100 mg total) by mouth 3 (three) times daily as needed for cough., Starting Tue 02/07/2017, Until Wed 02/07/2018, Print    fluticasone (FLONASE) 50 MCG/ACT nasal spray Place 2 sprays into both nostrils daily., Starting Tue 02/07/2017, Until Wed 02/07/2018, Print  sulfamethoxazole-trimethoprim (BACTRIM DS,SEPTRA DS) 800-160 MG tablet Take 1 tablet by mouth 2 (two) times daily., Starting Tue 02/07/2017, Print            This chart was dictated using voice recognition software/Dragon. Despite best efforts to proofread, errors can occur which can change the meaning. Any change was purely unintentional.    Laban Emperor, PA-C 02/07/17 1121    Delman Kitten, MD 02/07/17 [phone]

## 2017-07-04 ENCOUNTER — Other Ambulatory Visit: Payer: Self-pay

## 2017-07-04 ENCOUNTER — Emergency Department
Admission: EM | Admit: 2017-07-04 | Discharge: 2017-07-04 | Disposition: A | Discharge status: Home / Self Care | Payer: 59 | Attending: Emergency Medicine | Admitting: Emergency Medicine

## 2017-07-04 ENCOUNTER — Encounter: Payer: Self-pay | Admitting: Emergency Medicine

## 2017-07-04 DIAGNOSIS — R51 Headache: Secondary | ICD-10-CM | POA: Diagnosis present

## 2017-07-04 DIAGNOSIS — F17200 Nicotine dependence, unspecified, uncomplicated: Secondary | ICD-10-CM | POA: Diagnosis not present

## 2017-07-04 DIAGNOSIS — Z20828 Contact with and (suspected) exposure to other viral communicable diseases: Secondary | ICD-10-CM | POA: Diagnosis not present

## 2017-07-04 DIAGNOSIS — J45909 Unspecified asthma, uncomplicated: Secondary | ICD-10-CM | POA: Diagnosis not present

## 2017-07-04 DIAGNOSIS — R6883 Chills (without fever): Secondary | ICD-10-CM | POA: Insufficient documentation

## 2017-07-04 DIAGNOSIS — M791 Myalgia, unspecified site: Secondary | ICD-10-CM | POA: Insufficient documentation

## 2017-07-04 DIAGNOSIS — R05 Cough: Secondary | ICD-10-CM | POA: Insufficient documentation

## 2017-07-04 DIAGNOSIS — R197 Diarrhea, unspecified: Secondary | ICD-10-CM | POA: Insufficient documentation

## 2017-07-04 LAB — INFLUENZA PANEL BY PCR (TYPE A & B)
INFLAPCR: NEGATIVE
Influenza B By PCR: NEGATIVE

## 2017-07-04 MED ORDER — OSELTAMIVIR PHOSPHATE 75 MG PO CAPS: 75.0000 mg | ORAL_CAPSULE | Freq: Two times a day (BID) | ORAL | 0 refills | Status: AC

## 2017-07-04 MED ORDER — IBUPROFEN 600 MG PO TABS: 600.0000 mg | ORAL_TABLET | Freq: Three times a day (TID) | ORAL | 0 refills | Status: DC | PRN

## 2017-07-04 MED ORDER — PSEUDOEPH-BROMPHEN-DM 30-2-10 MG/5ML PO SYRP: 5.0000 mL | ORAL_SOLUTION | Freq: Four times a day (QID) | ORAL | 0 refills | Status: DC | PRN

## 2017-07-04 NOTE — ED Provider Notes (Signed)
[facility] Emergency Department Provider Note   ____________________________________________   First MD Initiated Contact with Patient 07/04/17 1019     (approximate)  I have reviewed the triage vital signs and the nursing notes.   HISTORY  Chief Complaint flu like symptoms    HPI Abigail Cunningham is a 37 y.o. female patient presented with flulike symptoms for 1 day.  Patient husband and son have  tested positive for flu.  Patient complained of headache, body, chills without fever, and cough.  Patient rates the pain as a 6/10.  Patient described pain is generalized aching".  Patient denies nausea vomiting but has diarrhea.  No palliative measure for complaint.  Past Medical History:  Diagnosis Date  . Asthma   . Bronchitis unk  . Chronic hepatitis ([place])   . 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   Past Surgical History:  Procedure Laterality Date  . ABDOMINAL HYSTERECTOMY    . OVARIAN CYST REMOVAL      Prior to Admission medications   Medication Sig Start Date End Date Taking? Authorizing Provider  benzonatate (TESSALON PERLES) 100 MG capsule Take 1 capsule (100 mg total) by mouth 3 (three) times daily as needed for cough. 02/07/17 02/07/18  Laban Emperor, PA-C  brompheniramine-pseudoephedrine-DM 30-2-10 MG/5ML syrup Take 5 mLs by mouth 4 (four) times daily as needed. 07/04/17   Sable Feil, PA-C  fluticasone (FLONASE) 50 MCG/ACT nasal spray Place 2 sprays into both nostrils daily. 02/07/17 02/07/18  Laban Emperor, PA-C  HYDROcodone-acetaminophen (NORCO/VICODIN) 5-325 MG per tablet Take 1 tablet by mouth every 4 (four) hours as needed for moderate pain. Patient not taking: Reported on 04/22/2016 10/23/14   Sherrie George B, FNP  ibuprofen (ADVIL,MOTRIN) 600 MG tablet Take 1 tablet (600 mg total) by mouth every 8 (eight) hours as needed. 07/04/17    Sable Feil, PA-C  lamoTRIgine (LAMICTAL) 25 MG tablet Take 50 mg by mouth 2 (two) times daily. Take 25 mg daily week 1; 25 mg twice daily week 2; 50 mg in the morning and 25 mg at night week 3; 50 mg twice daily week 4. 08/18/16   [provider]  meloxicam (MOBIC) 15 MG tablet Take 1 tablet (15 mg total) by mouth daily. Patient not taking: Reported on 04/22/2016 10/23/14   Sherrie George B, FNP  ondansetron (ZOFRAN ODT) 4 MG disintegrating tablet Take 1 tablet (4 mg total) by mouth every 8 (eight) hours as needed for nausea or vomiting. 09/13/16   Eula Listen, MD  oseltamivir (TAMIFLU) 75 MG capsule Take 1 capsule (75 mg total) by mouth 2 (two) times daily for 5 days. 07/04/17 07/09/17  Sable Feil, PA-C  senna-docusate (SENOKOT-S) 8.6-50 MG tablet Take 2 tablets by mouth daily as needed for mild constipation. 09/13/16 09/13/17  Eula Listen, MD  sulfamethoxazole-trimethoprim (BACTRIM DS,SEPTRA DS) 800-160 MG tablet Take 1 tablet by mouth 2 (two) times daily. 02/07/17   Laban Emperor, PA-C    Allergies Haldol [haloperidol lactate]  History reviewed. No pertinent family history.  Social History Social History   Tobacco Use  . Smoking status: Current Some Day Smoker  . Smokeless tobacco: Never Used  Substance Use Topics  . Alcohol use: No  . Drug use: Not on file    Review of Systems  Constitutional: Chills and body aches Eyes: No visual changes. ENT: No sore throat.  Nasal congestion runny nose Cardiovascular: Denies chest pain. Respiratory: Denies shortness of breath.  Nonproductive cough Gastrointestinal: No abdominal pain.  No nausea, no vomiting.  Diarrhea genitourinary: Negative for dysuria. Musculoskeletal: Negative for back pain. Skin: Negative for rash. Neurological: Positive for headaches, but denies focal weakness or numbness. Hematological/Lymphatic:Hepatitis Allergic/Immunilogical:  Haldol ____________________________________________   PHYSICAL EXAM:  VITAL SIGNS: ED Triage Vitals  Enc Vitals Group     BP 07/04/17 0849 123/71     Pulse Rate 07/04/17 0846 64     Resp 07/04/17 0846 18     Temp 07/04/17 0846 97.7 F (36.5 C)     Temp Source 07/04/17 0846 Oral     SpO2 07/04/17 0846 100 %     Weight 07/04/17 0847 185 lb (83.9 kg)     Height 07/04/17 0847 5\' 3"  (1.6 m)     Head Circumference --      Peak Flow --      Pain Score 07/04/17 0847 6     Pain Loc --      Pain Edu? --      Excl. in [place]? --    Constitutional: Alert and oriented. Well appearing and in no acute distress. Nose: Edematous nasal turbinates clear rhinorrhea Mouth/Throat: Mucous membranes are moist.  Oropharynx non-erythematous.  Postnasal drainage Neck: No stridor.  No cervical spine tenderness to palpation. Hematological/Lymphatic/Immunilogical: No cervical lymphadenopathy. Cardiovascular: Normal rate, regular rhythm. Grossly normal heart sounds.  Good peripheral circulation. Respiratory: Normal respiratory effort.  No retractions. Lungs CTAB. Gastrointestinal: Soft and nontender. No distention. No abdominal bruits. No CVA tenderness. Musculoskeletal: No lower extremity tenderness nor edema.  No joint effusions. Neurologic:  Normal speech and language. No gross focal neurologic deficits are appreciated. No gait instability. Skin:  Skin is warm, dry and intact. No rash noted. Psychiatric: Mood and affect are normal. Speech and behavior are normal.  ____________________________________________   LABS (all labs ordered are listed, but only abnormal results are displayed)  Labs Reviewed  INFLUENZA PANEL BY PCR (TYPE A & B)   ____________________________________________  EKG   ____________________________________________  RADIOLOGY  ED MD interpretation:    Official radiology report(s): No results  found.  ____________________________________________   PROCEDURES  Procedure(s) performed: None  Procedures  Critical Care performed: No  ____________________________________________   INITIAL IMPRESSION / ASSESSMENT AND PLAN / ED COURSE  As part of my medical decision making, I reviewed the following data within the electronic MEDICAL RECORD NUMBER    Viral respiratory infection consistent with influenza.  Patient test was negative but secondary to close family contact with influenza A will treat prophylactically.  Patient given discharge care instructions and a work note.  Patient advised to follow-up PCP as needed.      ____________________________________________   FINAL CLINICAL IMPRESSION(S) / ED DIAGNOSES  Final diagnoses:  Exposure to the flu     ED Discharge Orders        Ordered    oseltamivir (TAMIFLU) 75 MG capsule  2 times daily     07/04/17 1123    brompheniramine-pseudoephedrine-DM 30-2-10 MG/5ML syrup  4 times daily PRN     07/04/17 1123    ibuprofen (ADVIL,MOTRIN) 600 MG tablet  Every 8 hours PRN     07/04/17 1123       Note:  This document was prepared using Dragon voice recognition software and may include unintentional dictation errors.    Sable Feil, PA-C 07/04/17 1132    Earleen Newport, MD 07/04/17 [phone]

## 2017-07-04 NOTE — ED Triage Notes (Signed)
Here for flu like symptoms.  Husband and son have had same thing. C/o body aches, chills without fever, cough, headaches.  Appears well.  NAD. VSS.

## 2017-07-04 NOTE — ED Notes (Signed)
See triage note  Presents whit body aches and subjective fever  States she has had hot flashes/chills  afebrile on arrival

## 2017-07-04 NOTE — ED Notes (Signed)
First Nurse Note:  Patient complaining of "hot flashes and sweat bullets".  Denies temperature or cough.  Alert and oriented.  Color good.

## 2018-02-01 ENCOUNTER — Emergency Department: Payer: Managed Care, Other (non HMO)

## 2018-02-01 ENCOUNTER — Emergency Department
Admission: EM | Admit: 2018-02-01 | Discharge: 2018-02-01 | Disposition: A | Discharge status: Home / Self Care | Payer: Managed Care, Other (non HMO) | Source: Home / Self Care | Attending: Emergency Medicine | Admitting: Emergency Medicine

## 2018-02-01 ENCOUNTER — Other Ambulatory Visit: Payer: Self-pay

## 2018-02-01 ENCOUNTER — Encounter: Payer: Self-pay | Admitting: Emergency Medicine

## 2018-02-01 DIAGNOSIS — N200 Calculus of kidney: Secondary | ICD-10-CM

## 2018-02-01 DIAGNOSIS — N83202 Unspecified ovarian cyst, left side: Secondary | ICD-10-CM

## 2018-02-01 DIAGNOSIS — F17200 Nicotine dependence, unspecified, uncomplicated: Secondary | ICD-10-CM | POA: Insufficient documentation

## 2018-02-01 DIAGNOSIS — N12 Tubulo-interstitial nephritis, not specified as acute or chronic: Secondary | ICD-10-CM | POA: Diagnosis not present

## 2018-02-01 DIAGNOSIS — N136 Pyonephrosis: Secondary | ICD-10-CM | POA: Diagnosis not present

## 2018-02-01 DIAGNOSIS — A599 Trichomoniasis, unspecified: Secondary | ICD-10-CM

## 2018-02-01 DIAGNOSIS — J45909 Unspecified asthma, uncomplicated: Secondary | ICD-10-CM

## 2018-02-01 LAB — CBC WITH DIFFERENTIAL/PLATELET
Basophils Absolute: 0.1 10*3/uL (ref 0–0.1)
Basophils Relative: 1 %
EOS PCT: 1 %
Eosinophils Absolute: 0.1 10*3/uL (ref 0–0.7)
HCT: 37.5 % (ref 35.0–47.0)
Hemoglobin: 12.9 g/dL (ref 12.0–16.0)
LYMPHS ABS: 2.2 10*3/uL (ref 1.0–3.6)
LYMPHS PCT: 20 %
MCH: 30 pg (ref 26.0–34.0)
MCHC: 34.5 g/dL (ref 32.0–36.0)
MCV: 86.8 fL (ref 80.0–100.0)
MONO ABS: 1.2 10*3/uL — AB (ref 0.2–0.9)
Monocytes Relative: 11 %
Neutro Abs: 7.4 10*3/uL — ABNORMAL HIGH (ref 1.4–6.5)
Neutrophils Relative %: 67 %
PLATELETS: 150 10*3/uL (ref 150–440)
RBC: 4.32 MIL/uL (ref 3.80–5.20)
RDW: 14.1 % (ref 11.5–14.5)
WBC: 11 10*3/uL (ref 3.6–11.0)

## 2018-02-01 LAB — URINALYSIS, COMPLETE (UACMP) WITH MICROSCOPIC
Bilirubin Urine: NEGATIVE
Glucose, UA: NEGATIVE mg/dL
KETONES UR: NEGATIVE mg/dL
NITRITE: POSITIVE — AB
PROTEIN: 100 mg/dL — AB
RBC / HPF: >50 RBC/hpf — ABNORMAL HIGH (ref 0–5)
Specific Gravity, Urine: 1.015 (ref 1.005–1.030)
WBC, UA: >50 WBC/hpf — ABNORMAL HIGH (ref 0–5)
pH: 6 (ref 5.0–8.0)

## 2018-02-01 LAB — COMPREHENSIVE METABOLIC PANEL
ALT: 29 U/L (ref 0–44)
AST: 25 U/L (ref 15–41)
Albumin: 4.3 g/dL (ref 3.5–5.0)
Alkaline Phosphatase: 54 U/L (ref 38–126)
Anion gap: 6 (ref 5–15)
BILIRUBIN TOTAL: 0.5 mg/dL (ref 0.3–1.2)
BUN: 7 mg/dL (ref 6–20)
CALCIUM: 9.2 mg/dL (ref 8.9–10.3)
CHLORIDE: 108 mmol/L (ref 98–111)
CO2: 24 mmol/L (ref 22–32)
CREATININE: 0.7 mg/dL (ref 0.44–1.00)
Glucose, Bld: 97 mg/dL (ref 70–99)
Potassium: 3.7 mmol/L (ref 3.5–5.1)
Sodium: 138 mmol/L (ref 135–145)
TOTAL PROTEIN: 7.6 g/dL (ref 6.5–8.1)

## 2018-02-01 LAB — POCT PREGNANCY, URINE: PREG TEST UR: NEGATIVE

## 2018-02-01 MED ORDER — OXYCODONE-ACETAMINOPHEN 5-325 MG PO TABS: 1.0000 | ORAL_TABLET | Freq: Four times a day (QID) | ORAL | 0 refills | Status: DC | PRN

## 2018-02-01 MED ORDER — CIPROFLOXACIN HCL 500 MG PO TABS: 500.0000 mg | ORAL_TABLET | Freq: Two times a day (BID) | ORAL | 0 refills | Status: AC

## 2018-02-01 MED ORDER — METRONIDAZOLE 500 MG PO TABS: 500.0000 mg | ORAL_TABLET | Freq: Two times a day (BID) | ORAL | 0 refills | Status: DC

## 2018-02-01 MED ORDER — LORAZEPAM 2 MG/ML IJ SOLN
0.5000 mg | Freq: Once | INTRAMUSCULAR | Status: AC
  Administered 2018-02-01: 0.5 mg via INTRAVENOUS
  Filled 2018-02-01: qty 1

## 2018-02-01 MED ORDER — SODIUM CHLORIDE 0.9 % IV SOLN
Freq: Once | INTRAVENOUS | Status: AC
  Administered 2018-02-01: 21:00:00 via INTRAVENOUS

## 2018-02-01 MED ORDER — AZITHROMYCIN 500 MG PO TABS
1000.0000 mg | ORAL_TABLET | Freq: Once | ORAL | Status: AC
  Administered 2018-02-01: 1000 mg via ORAL
  Filled 2018-02-01: qty 2

## 2018-02-01 MED ORDER — SODIUM CHLORIDE 0.9 % IV SOLN
1.0000 g | Freq: Once | INTRAVENOUS | Status: AC
  Administered 2018-02-01: 1 g via INTRAVENOUS

## 2018-02-01 MED ORDER — ONDANSETRON HCL 4 MG/2ML IJ SOLN
4.0000 mg | Freq: Once | INTRAMUSCULAR | Status: AC
Start: 2018-02-01 — End: 2018-02-01
  Administered 2018-02-01: 4 mg via INTRAVENOUS
  Filled 2018-02-01: qty 2

## 2018-02-01 MED ORDER — KETOROLAC TROMETHAMINE 30 MG/ML IJ SOLN
30.0000 mg | Freq: Once | INTRAMUSCULAR | Status: AC
  Administered 2018-02-01: 30 mg via INTRAVENOUS
  Filled 2018-02-01: qty 1

## 2018-02-01 MED ORDER — SODIUM CHLORIDE 0.9 % IV SOLN
INTRAVENOUS | Status: AC
  Administered 2018-02-01: 1 g via INTRAVENOUS
  Filled 2018-02-01: qty 10

## 2018-02-01 MED ORDER — PHENAZOPYRIDINE HCL 200 MG PO TABS: 200.0000 mg | ORAL_TABLET | Freq: Three times a day (TID) | ORAL | 0 refills | Status: DC | PRN

## 2018-02-01 NOTE — ED Provider Notes (Signed)
[facility] Emergency Department Provider Note       Time seen: ----------------------------------------- 8:34 PM on 02/01/2018 -----------------------------------------   I have reviewed the triage vital signs and the nursing notes.  HISTORY   Chief Complaint Flank Pain    HPI Abigail Cunningham is a 37 y.o. female with a history of asthma, bronchitis, hepatitis, kidney stones who presents to the ED for right flank pain that radiates into her abdomen company by headache and "jitteriness".  Patient states she has had a history of kidney stones and UTI.  She was seen here a year ago for UTI and also had an ovarian cyst at that point.  She report this pain feels differently, she has had some nausea.  Past Medical History:  Diagnosis Date  . Asthma   . Bronchitis unk  . Chronic hepatitis ([place])   . Immune deficiency disorder ([place])    chronic autoimmune hepatitis per pt    There are no active problems to display for this patient.   Past Surgical History:  Procedure Laterality Date  . ABDOMINAL HYSTERECTOMY    . OVARIAN CYST REMOVAL      Allergies Haldol [haloperidol lactate]  Social History Social History   Tobacco Use  . Smoking status: Current Some Day Smoker  . Smokeless tobacco: Never Used  Substance Use Topics  . Alcohol use: No  . Drug use: Not on file   Review of Systems Constitutional: Negative for fever. Cardiovascular: Negative for chest pain. Respiratory: Negative for shortness of breath. Gastrointestinal: Positive for abdominal pain, nausea Genitourinary: Negative for dysuria. Musculoskeletal: Positive for back pain Skin: Negative for rash. Neurological: Negative for headaches, focal weakness or numbness.  All systems negative/normal/unremarkable except as stated in the HPI  ____________________________________________   PHYSICAL EXAM:  VITAL SIGNS: ED Triage Vitals  Enc Vitals Group     BP 02/01/18 2012 105/70      Pulse Rate 02/01/18 2012 93     Resp 02/01/18 2012 18     Temp 02/01/18 2012 99.2 F (37.3 C)     Temp Source 02/01/18 2012 Oral     SpO2 02/01/18 2012 97 %     Weight 02/01/18 2013 185 lb (83.9 kg)     Height 02/01/18 2013 5\' 3"  (1.6 m)     Head Circumference --      Peak Flow --      Pain Score 02/01/18 2012 8     Pain Loc --      Pain Edu? --      Excl. in [place]? --    Constitutional: Alert and oriented. Well appearing and in no distress. Eyes: Conjunctivae are normal. Normal extraocular movements. Cardiovascular: Normal rate, regular rhythm. No murmurs, rubs, or gallops. Respiratory: Normal respiratory effort without tachypnea nor retractions. Breath sounds are clear and equal bilaterally. No wheezes/rales/rhonchi. Gastrointestinal: Right flank and right CVA tenderness, no rebound or guarding.  Normal bowel sounds. Musculoskeletal: Nontender with normal range of motion in extremities. No lower extremity tenderness nor edema. Neurologic:  Normal speech and language. No gross focal neurologic deficits are appreciated.  Skin:  Skin is warm, dry and intact. No rash noted. Psychiatric: Anxious, no distress ____________________________________________  ED COURSE:  As part of my medical decision making, I reviewed the following data within the [place] History obtained from family if available, nursing notes, old chart and ekg, as well as notes from prior ED visits. Patient presented for flank pain, we will assess with  labs and imaging as indicated at this time. Clinical Course as of Feb 02 2203  Thu Feb 01, 2018  2140 Trichomonas, UA(!): PRESENT [JW]  2140 RBC / HPF(!): >50 [JW]  2140 WBC, UA(!): >50 [JW]  2140 Bacteria, UA(!): MANY [JW]  2140 Nitrite(!): POSITIVE [JW]  2140 Leukocytes, UA(!): MODERATE [JW]  2140 Hgb urine dipstick(!): LARGE [JW]    Clinical Course User Index [JW] Earleen Newport, MD    Procedures ____________________________________________   LABS (pertinent positives/negatives)  Labs Reviewed  CBC WITH DIFFERENTIAL/PLATELET - Abnormal; Notable for the following components:      Result Value   Neutro Abs 7.4 (*)    Monocytes Absolute 1.2 (*)    All other components within normal limits  URINALYSIS, COMPLETE (UACMP) WITH MICROSCOPIC - Abnormal; Notable for the following components:   Color, Urine AMBER (*)    APPearance TURBID (*)    Hgb urine dipstick LARGE (*)    Protein, ur 100 (*)    Nitrite POSITIVE (*)    Leukocytes, UA MODERATE (*)    RBC / HPF >50 (*)    WBC, UA >50 (*)    Bacteria, UA MANY (*)    Trichomonas, UA PRESENT (*)    Non Squamous Epithelial PRESENT (*)    All other components within normal limits  COMPREHENSIVE METABOLIC PANEL  POCT PREGNANCY, URINE    RADIOLOGY Images were viewed by me  CT renal protocol IMPRESSION: 1. Mild right hydronephrosis and hydroureter but no definitive ureteral stone is seen, question recently passed stone. 2. Increased density within the central kidneys bilaterally suggesting nephrocalcinosis. Small stone in the upper pole right kidney. 3. Cirrhotic appearance of liver with splenomegaly 4. 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 Suggest gynecologic surgical consult.  ____________________________________________  DIFFERENTIAL DIAGNOSIS   Renal colic, UTI, pyelonephritis, gas pain, constipation  FINAL ASSESSMENT AND PLAN  Flank pain, pyelonephritis, ovarian cyst, trichomoniasis   Plan: The patient had presented for right flank pain. Patient's labs regarding her blood tests were unremarkable, her urine reveal significant urinary tract infection.  She also tested positive for trichomonas.  We gave her a gram of Rocephin here and I will give her a gram of Zithromax p.o.  She will be discharged with Flagyl and Cipro.  She will also be  on Pyridium for bladder spasms she will need to follow-up with urologist as an outpatient.  Patient's imaging revealed right-sided hydronephrosis and hydroureter as well as a large left ovarian cyst concerning for neoplasm.  She will be referred to GYN for same.   Laurence Aly, MD   Note: This note was generated in part or whole with voice recognition software. Voice recognition is usually quite accurate but there are transcription errors that can and very often do occur. I apologize for any typographical errors that were not detected and corrected.     Earleen Newport, MD 02/01/18 2206

## 2018-02-01 NOTE — ED Notes (Signed)
Pt bladder scanned for 670 cc. Pt then urinated and another scan taken with 237cc remaining.

## 2018-02-01 NOTE — ED Triage Notes (Signed)
Patient ambulatory to triage with steady gait, without difficulty or distress noted; pt reports having right flank pain radiating into abd accomp by HA since this am

## 2018-02-03 ENCOUNTER — Encounter: Payer: Self-pay | Admitting: Emergency Medicine

## 2018-02-03 ENCOUNTER — Inpatient Hospital Stay: Payer: Managed Care, Other (non HMO)

## 2018-02-03 ENCOUNTER — Other Ambulatory Visit: Payer: Self-pay

## 2018-02-03 ENCOUNTER — Inpatient Hospital Stay
Admission: EM | Admit: 2018-02-03 | Discharge: 2018-02-05 | DRG: 690 | Disposition: A | Discharge status: Home / Self Care | Payer: Managed Care, Other (non HMO) | Attending: Internal Medicine | Admitting: Internal Medicine

## 2018-02-03 DIAGNOSIS — K754 Autoimmune hepatitis: Secondary | ICD-10-CM | POA: Diagnosis present

## 2018-02-03 DIAGNOSIS — J45909 Unspecified asthma, uncomplicated: Secondary | ICD-10-CM | POA: Diagnosis present

## 2018-02-03 DIAGNOSIS — N10 Acute tubulo-interstitial nephritis: Secondary | ICD-10-CM | POA: Diagnosis present

## 2018-02-03 DIAGNOSIS — G43909 Migraine, unspecified, not intractable, without status migrainosus: Secondary | ICD-10-CM | POA: Diagnosis present

## 2018-02-03 DIAGNOSIS — F17200 Nicotine dependence, unspecified, uncomplicated: Secondary | ICD-10-CM | POA: Diagnosis present

## 2018-02-03 DIAGNOSIS — F603 Borderline personality disorder: Secondary | ICD-10-CM | POA: Diagnosis present

## 2018-02-03 DIAGNOSIS — Z79899 Other long term (current) drug therapy: Secondary | ICD-10-CM

## 2018-02-03 DIAGNOSIS — N136 Pyonephrosis: Principal | ICD-10-CM | POA: Diagnosis present

## 2018-02-03 DIAGNOSIS — Z791 Long term (current) use of non-steroidal anti-inflammatories (NSAID): Secondary | ICD-10-CM

## 2018-02-03 DIAGNOSIS — N12 Tubulo-interstitial nephritis, not specified as acute or chronic: Secondary | ICD-10-CM

## 2018-02-03 DIAGNOSIS — Z888 Allergy status to other drugs, medicaments and biological substances status: Secondary | ICD-10-CM

## 2018-02-03 HISTORY — DX: Calculus of kidney: N20.0

## 2018-02-03 HISTORY — DX: Migraine, unspecified, not intractable, without status migrainosus: G43.909

## 2018-02-03 HISTORY — DX: Polyosteoarthritis, unspecified: M15.9

## 2018-02-03 HISTORY — DX: Borderline personality disorder: F60.3

## 2018-02-03 LAB — URINALYSIS, COMPLETE (UACMP) WITH MICROSCOPIC
BILIRUBIN URINE: NEGATIVE
GLUCOSE, UA: NEGATIVE mg/dL
Hgb urine dipstick: NEGATIVE
Ketones, ur: NEGATIVE mg/dL
LEUKOCYTES UA: NEGATIVE
NITRITE: POSITIVE — AB
PH: 6 (ref 5.0–8.0)
Protein, ur: 30 mg/dL — AB
SPECIFIC GRAVITY, URINE: 1.018 (ref 1.005–1.030)

## 2018-02-03 LAB — COMPREHENSIVE METABOLIC PANEL
ALK PHOS: 57 U/L (ref 38–126)
ALT: 21 U/L (ref 0–44)
ANION GAP: 8 (ref 5–15)
AST: 20 U/L (ref 15–41)
Albumin: 3.8 g/dL (ref 3.5–5.0)
BILIRUBIN TOTAL: 0.6 mg/dL (ref 0.3–1.2)
BUN: 8 mg/dL (ref 6–20)
CALCIUM: 9.1 mg/dL (ref 8.9–10.3)
CO2: 23 mmol/L (ref 22–32)
Chloride: 106 mmol/L (ref 98–111)
Creatinine, Ser: 0.66 mg/dL (ref 0.44–1.00)
GFR calc Af Amer: >60 mL/min (ref >60)
GFR calc non Af Amer: >60 mL/min (ref >60)
Glucose, Bld: 166 mg/dL — ABNORMAL HIGH (ref 70–99)
POTASSIUM: 3.8 mmol/L (ref 3.5–5.1)
Sodium: 137 mmol/L (ref 135–145)
Total Protein: 7.9 g/dL (ref 6.5–8.1)

## 2018-02-03 LAB — CBC WITH DIFFERENTIAL/PLATELET
BAND NEUTROPHILS: 0 %
Basophils Absolute: 0 10*3/uL (ref 0–0.1)
Basophils Relative: 0 %
Blasts: 0 %
EOS ABS: 0.1 10*3/uL (ref 0–0.7)
EOS PCT: 2 %
HCT: 38.5 % (ref 35.0–47.0)
Hemoglobin: 13.4 g/dL (ref 12.0–16.0)
LYMPHS ABS: 1.6 10*3/uL (ref 1.0–3.6)
LYMPHS PCT: 22 %
MCH: 30.2 pg (ref 26.0–34.0)
MCHC: 34.8 g/dL (ref 32.0–36.0)
MCV: 86.8 fL (ref 80.0–100.0)
MONO ABS: 0.6 10*3/uL (ref 0.2–0.9)
Metamyelocytes Relative: 0 %
Monocytes Relative: 9 %
Myelocytes: 0 %
NEUTROS ABS: 4.9 10*3/uL (ref 1.4–6.5)
NEUTROS PCT: 67 %
NRBC: 0 /100{WBCs}
OTHER: 0 %
PLATELETS: 137 10*3/uL — AB (ref 150–440)
Promyelocytes Relative: 0 %
RBC: 4.44 MIL/uL (ref 3.80–5.20)
RDW: 14.4 % (ref 11.5–14.5)
WBC: 7.2 10*3/uL (ref 3.6–11.0)

## 2018-02-03 LAB — PROTIME-INR
INR: 0.99
PROTHROMBIN TIME: 13 s (ref 11.4–15.2)

## 2018-02-03 LAB — TSH: TSH: 1.178 u[IU]/mL (ref 0.350–4.500)

## 2018-02-03 LAB — LACTIC ACID, PLASMA: Lactic Acid, Venous: 1 mmol/L (ref 0.5–1.9)

## 2018-02-03 MED ORDER — FENTANYL CITRATE (PF) 100 MCG/2ML IJ SOLN
50.0000 ug | INTRAMUSCULAR | Status: DC | PRN
  Administered 2018-02-03: 50 ug via INTRAVENOUS
  Filled 2018-02-03: qty 2

## 2018-02-03 MED ORDER — ONDANSETRON HCL 4 MG/2ML IJ SOLN
4.0000 mg | Freq: Four times a day (QID) | INTRAMUSCULAR | Status: DC | PRN
  Administered 2018-02-05: 4 mg via INTRAVENOUS
  Filled 2018-02-03: qty 2

## 2018-02-03 MED ORDER — HYDROCODONE-ACETAMINOPHEN 5-325 MG PO TABS
1.0000 | ORAL_TABLET | ORAL | Status: DC | PRN
  Administered 2018-02-04 – 2018-02-05 (×2): 1 via ORAL
  Filled 2018-02-03 (×2): qty 1

## 2018-02-03 MED ORDER — ACETAMINOPHEN 325 MG PO TABS: 650.0000 mg | ORAL_TABLET | Freq: Four times a day (QID) | ORAL | Status: DC | PRN

## 2018-02-03 MED ORDER — LAMOTRIGINE 25 MG PO TABS
50.0000 mg | ORAL_TABLET | Freq: Two times a day (BID) | ORAL | Status: DC
  Administered 2018-02-03 – 2018-02-05 (×4): 50 mg via ORAL
  Filled 2018-02-03 (×4): qty 2

## 2018-02-03 MED ORDER — KETOROLAC TROMETHAMINE 30 MG/ML IJ SOLN
30.0000 mg | Freq: Four times a day (QID) | INTRAMUSCULAR | Status: DC | PRN
  Administered 2018-02-03 – 2018-02-04 (×2): 30 mg via INTRAVENOUS
  Filled 2018-02-03 (×2): qty 1

## 2018-02-03 MED ORDER — ONDANSETRON HCL 4 MG PO TABS: 4.0000 mg | ORAL_TABLET | Freq: Four times a day (QID) | ORAL | Status: DC | PRN

## 2018-02-03 MED ORDER — ONDANSETRON HCL 4 MG/2ML IJ SOLN
4.0000 mg | Freq: Once | INTRAMUSCULAR | Status: AC | PRN
  Administered 2018-02-03: 4 mg via INTRAVENOUS
  Filled 2018-02-03: qty 2

## 2018-02-03 MED ORDER — SUMATRIPTAN SUCCINATE 6 MG/0.5ML ~~LOC~~ SOLN
6.0000 mg | Freq: Once | SUBCUTANEOUS | Status: AC
  Administered 2018-02-03: 6 mg via SUBCUTANEOUS
  Filled 2018-02-03: qty 0.5

## 2018-02-03 MED ORDER — SODIUM CHLORIDE 0.9 % IV BOLUS
1000.0000 mL | Freq: Once | INTRAVENOUS | Status: AC
  Administered 2018-02-03: 1000 mL via INTRAVENOUS

## 2018-02-03 MED ORDER — FLUTICASONE PROPIONATE 50 MCG/ACT NA SUSP
2.0000 | Freq: Every day | NASAL | Status: DC
  Filled 2018-02-03: qty 16

## 2018-02-03 MED ORDER — ACETAMINOPHEN 650 MG RE SUPP: 650.0000 mg | Freq: Four times a day (QID) | RECTAL | Status: DC | PRN

## 2018-02-03 MED ORDER — FENTANYL CITRATE (PF) 100 MCG/2ML IJ SOLN
50.0000 ug | Freq: Once | INTRAMUSCULAR | Status: AC
  Administered 2018-02-03: 50 ug via INTRAVENOUS
  Filled 2018-02-03: qty 2

## 2018-02-03 MED ORDER — ENOXAPARIN SODIUM 40 MG/0.4ML ~~LOC~~ SOLN
40.0000 mg | SUBCUTANEOUS | Status: DC
  Administered 2018-02-03 – 2018-02-04 (×2): 40 mg via SUBCUTANEOUS
  Filled 2018-02-03 (×2): qty 0.4

## 2018-02-03 MED ORDER — SODIUM CHLORIDE 0.9 % IV SOLN
1.0000 g | Freq: Once | INTRAVENOUS | Status: AC
  Administered 2018-02-03: 1 g via INTRAVENOUS
  Filled 2018-02-03: qty 10

## 2018-02-03 MED ORDER — SODIUM CHLORIDE 0.9 % IV SOLN
1.0000 g | INTRAVENOUS | Status: DC
  Administered 2018-02-04: 1 g via INTRAVENOUS
  Filled 2018-02-03: qty 10
  Filled 2018-02-03: qty 1

## 2018-02-03 MED ORDER — INFLUENZA VAC SPLIT QUAD 0.5 ML IM SUSY: 0.5000 mL | PREFILLED_SYRINGE | INTRAMUSCULAR | Status: DC

## 2018-02-03 MED ORDER — SODIUM CHLORIDE 0.9 % IV SOLN
INTRAVENOUS | Status: DC
  Administered 2018-02-03 – 2018-02-05 (×4): via INTRAVENOUS

## 2018-02-03 NOTE — Progress Notes (Signed)
[facility] at [place] was admitted to the Hospital on 02/03/2018 and will be here in   hospital for unknown amount of time.   .  Call Dustin Flock MD with questions.  Dustin Flock M.D on 02/03/2018,at 6:41 PM  [facility]  [phone]

## 2018-02-03 NOTE — ED Triage Notes (Signed)
Seen here 2 days ago and dx with pylonephritis, ovarian cyst possible CA.  Has been on abx, percocet, and pyridium X 2 days with worsening sx.  C/o generalized body pains, abdominal pain, back pain, sweats/chills, and headache.  Tearful in triage.

## 2018-02-03 NOTE — ED Notes (Signed)
Pt informed of needing urine. Pt given urine specimen cup and little water so that she can give sample

## 2018-02-03 NOTE — H&P (Signed)
[facility] [place] NAME: Sweta Halseth    MR#:  [id number]  DATE OF BIRTH:  11-11-1980  DATE OF ADMISSION:  02/03/2018  PRIMARY CARE PHYSICIAN: Patient, No Pcp Per   REQUESTING/REFERRING PHYSICIAN: Earleen Newport, MD  CHIEF COMPLAINT:   Chief Complaint  Patient presents with  . Migraine  . Chills    HISTORY OF PRESENT ILLNESS: Toneisha Savary  is a 37 y.o. female with a known history of asthma, autoimmune hepatitis, migraine who was seen in the emergency room 2 days ago with complaint of flank pain and was noted to have pyelonephritis who is presenting back with severe chills pain all over as well as migraines.  Patient states that she did take her antibiotics as prescribed however she is continued to feel bad.  Currently complains of severe migraine.  Pain all over.   PAST MEDICAL HISTORY:   Past Medical History:  Diagnosis Date  . Asthma   . Borderline personality disorder ([place])   . Bronchitis unk  . Chronic hepatitis ([place])   . Generalized OA   . Immune deficiency disorder ([place])    chronic autoimmune hepatitis per pt  . Kidney stone   . Migraine     PAST SURGICAL HISTORY:  Past Surgical History:  Procedure Laterality Date  . ABDOMINAL HYSTERECTOMY    . LITHOTRIPSY    . OVARIAN CYST REMOVAL      SOCIAL HISTORY:  Social History   Tobacco Use  . Smoking status: Current Some Day Smoker  . Smokeless tobacco: Never Used  Substance Use Topics  . Alcohol use: No    FAMILY HISTORY:  Family History  Adopted: Yes    DRUG ALLERGIES:  Allergies  Allergen Reactions  . Haldol [Haloperidol Lactate] Other (See Comments)    Shuts down 'system'    REVIEW OF SYSTEMS:   CONSTITUTIONAL: No fever, fatigue or weakness.  EYES: No blurred or double vision.  EARS, NOSE, AND THROAT: No tinnitus or ear pain.  RESPIRATORY: No cough, shortness of breath, wheezing or hemoptysis.  CARDIOVASCULAR: No chest pain,  orthopnea, edema.  GASTROINTESTINAL: No nausea, vomiting, diarrhea or abdominal pain.  GENITOURINARY: No dysuria, hematuria.  ENDOCRINE: No polyuria, nocturia,  HEMATOLOGY: No anemia, easy bruising or bleeding SKIN: No rash or lesion. MUSCULOSKELETAL: Generalized pain in all joints.   NEUROLOGIC: No tingling, numbness, weakness.  Positive for migraine PSYCHIATRY: No anxiety or depression.   MEDICATIONS AT HOME:  Prior to Admission medications   Medication Sig Start Date End Date Taking? Authorizing Provider  benzonatate (TESSALON PERLES) 100 MG capsule Take 1 capsule (100 mg total) by mouth 3 (three) times daily as needed for cough. 02/07/17 02/07/18  Laban Emperor, PA-C  brompheniramine-pseudoephedrine-DM 30-2-10 MG/5ML syrup Take 5 mLs by mouth 4 (four) times daily as needed. 07/04/17   Sable Feil, PA-C  ciprofloxacin (CIPRO) 500 MG tablet Take 1 tablet (500 mg total) by mouth 2 (two) times daily for 10 days. 02/01/18 02/11/18  Earleen Newport, MD  fluticasone (FLONASE) 50 MCG/ACT nasal spray Place 2 sprays into both nostrils daily. 02/07/17 02/07/18  Laban Emperor, PA-C  HYDROcodone-acetaminophen (NORCO/VICODIN) 5-325 MG per tablet Take 1 tablet by mouth every 4 (four) hours as needed for moderate pain. Patient not taking: Reported on 04/22/2016 10/23/14   Sherrie George B, FNP  ibuprofen (ADVIL,MOTRIN) 600 MG tablet Take 1 tablet (600 mg total) by mouth every 8 (eight) hours as needed. 07/04/17   Sable Feil,  PA-C  lamoTRIgine (LAMICTAL) 25 MG tablet Take 50 mg by mouth 2 (two) times daily. Take 25 mg daily week 1; 25 mg twice daily week 2; 50 mg in the morning and 25 mg at night week 3; 50 mg twice daily week 4. 08/18/16   [provider]  meloxicam (MOBIC) 15 MG tablet Take 1 tablet (15 mg total) by mouth daily. Patient not taking: Reported on 04/22/2016 10/23/14   Sherrie George B, FNP  metroNIDAZOLE (FLAGYL) 500 MG tablet Take 1 tablet (500 mg total) by mouth 2 (two) times  daily. 02/01/18   Earleen Newport, MD  ondansetron (ZOFRAN ODT) 4 MG disintegrating tablet Take 1 tablet (4 mg total) by mouth every 8 (eight) hours as needed for nausea or vomiting. 09/13/16   Eula Listen, MD  oxyCODONE-acetaminophen (PERCOCET) 5-325 MG tablet Take 1 tablet by mouth every 6 (six) hours as needed. 02/01/18   Earleen Newport, MD  phenazopyridine (PYRIDIUM) 200 MG tablet Take 1 tablet (200 mg total) by mouth 3 (three) times daily as needed for pain. 02/01/18 02/01/19  Earleen Newport, MD  sulfamethoxazole-trimethoprim (BACTRIM DS,SEPTRA DS) 800-160 MG tablet Take 1 tablet by mouth 2 (two) times daily. 02/07/17   Laban Emperor, PA-C      PHYSICAL EXAMINATION:   VITAL SIGNS: Blood pressure 134/90, pulse 99, temperature 97.8 F (36.6 C), temperature source Oral, resp. rate 17, height 5\' 3"  (1.6 m), weight 83.9 kg, last menstrual period 01/18/2018, SpO2 99 %.  GENERAL:  37 y.o.-year-old patient lying in the bed with no acute distress.  EYES: Pupils equal, round, reactive to light and accommodation. No scleral icterus. Extraocular muscles intact.  HEENT: Head atraumatic, normocephalic. Oropharynx and nasopharynx clear.  NECK:  Supple, no jugular venous distention. No thyroid enlargement, no tenderness.  LUNGS: Normal breath sounds bilaterally, no wheezing, rales,rhonchi or crepitation. No use of accessory muscles of respiration.  CARDIOVASCULAR: S1, S2 normal. No murmurs, rubs, or gallops.  ABDOMEN: Soft, nontender, nondistended. Bowel sounds present. No organomegaly or mass.  EXTREMITIES: No pedal edema, cyanosis, or clubbing.  NEUROLOGIC: Cranial nerves II through XII are intact. Muscle strength 5/5 in all extremities. Sensation intact. Gait not checked.  PSYCHIATRIC: The patient is alert and oriented x 3.  SKIN: No obvious rash, lesion, or ulcer.   LABORATORY PANEL:   CBC Recent Labs  Lab 02/01/18 2015 02/03/18 1412  WBC 11.0 7.2  HGB 12.9 13.4  HCT  37.5 38.5  PLT 150 137*  MCV 86.8 86.8  MCH 30.0 30.2  MCHC 34.5 34.8  RDW 14.1 14.4  LYMPHSABS 2.2 1.6  MONOABS 1.2* 0.6  EOSABS 0.1 0.1  BASOSABS 0.1 0.0   ------------------------------------------------------------------------------------------------------------------  Chemistries  Recent Labs  Lab 02/01/18 2015 02/03/18 1412  NA 138 137  K 3.7 3.8  CL 108 106  CO2 24 23  GLUCOSE 97 166*  BUN 7 8  CREATININE 0.70 0.66  CALCIUM 9.2 9.1  AST 25 20  ALT 29 21  ALKPHOS 54 57  BILITOT 0.5 0.6   ------------------------------------------------------------------------------------------------------------------ estimated creatinine clearance is 97.8 mL/min (by C-G formula based on SCr of 0.66 mg/dL). ------------------------------------------------------------------------------------------------------------------ Recent Labs    02/03/18 1417  TSH 1.178     Coagulation profile Recent Labs  Lab 02/03/18 1412  INR 0.99   ------------------------------------------------------------------------------------------------------------------- No results for input(s): DDIMER in the last 72 hours. -------------------------------------------------------------------------------------------------------------------  Cardiac Enzymes No results for input(s): CKMB, TROPONINI, MYOGLOBIN in the last 168 hours.  Invalid input(s): CK ------------------------------------------------------------------------------------------------------------------ Invalid input(s):  POCBNP  ---------------------------------------------------------------------------------------------------------------  Urinalysis    Component Value Date/Time   COLORURINE AMBER (A) 02/03/2018 1632   APPEARANCEUR CLEAR (A) 02/03/2018 1632   APPEARANCEUR Cloudy 08/19/2012 1457   LABSPEC 1.018 02/03/2018 1632   LABSPEC 1.019 08/19/2012 1457   PHURINE 6.0 02/03/2018 1632   GLUCOSEU NEGATIVE 02/03/2018 1632   GLUCOSEU  Negative 08/19/2012 1457   HGBUR NEGATIVE 02/03/2018 1632   BILIRUBINUR NEGATIVE 02/03/2018 1632   BILIRUBINUR Negative 08/19/2012 1457   KETONESUR NEGATIVE 02/03/2018 1632   PROTEINUR 30 (A) 02/03/2018 1632   UROBILINOGEN 4.0 (H) 03/29/2008 2159   NITRITE POSITIVE (A) 02/03/2018 1632   LEUKOCYTESUR NEGATIVE 02/03/2018 1632   LEUKOCYTESUR 3+ 08/19/2012 1457     RADIOLOGY: Ct Renal Stone Study  Result Date: 02/03/2018 CLINICAL DATA:  37 year old female with acute abdominal and flank pain, sweats and chills. EXAM: CT ABDOMEN AND PELVIS WITHOUT CONTRAST TECHNIQUE: Multidetector CT imaging of the abdomen and pelvis was performed following the standard protocol without IV contrast. COMPARISON:  02/01/2018 and prior CTs FINDINGS: Please note that parenchymal abnormalities may be missed without intravenous contrast. Lower chest: No acute abnormality Hepatobiliary: Nodular hepatic contour is compatible with cirrhosis. No focal hepatic abnormalities identified on this noncontrast study. The gallbladder is unremarkable. No biliary dilatation. Pancreas: Unremarkable Spleen: Mild splenomegaly again noted. Adrenals/Urinary Tract: Bilateral perinephric stranding, RIGHT greater than LEFT, and RIGHT periureteral stranding again noted. 2 mm nonobstructing RIGHT UPPER pole renal calculus again noted. Increased density/calcifications in the medullary regions of both kidneys are present and suggestive of medullary sponge kidney. No hydronephrosis or obstructing urinary calculi identified. The bladder and adrenal glands are unremarkable. Stomach/Bowel: Stomach is within normal limits. Appendix appears normal. No evidence of bowel wall thickening, distention, or inflammatory changes. Vascular/Lymphatic: No significant vascular findings are present. No enlarged abdominal or pelvic lymph nodes. Reproductive: A 10 x 12 cm LEFT ovarian/adnexal cyst is again noted. No other interval change. Other: No ascites or  pneumoperitoneum. Musculoskeletal: No acute or suspicious bony abnormalities. IMPRESSION: 1. Unchanged bilateral perinephric stranding, RIGHT greater and LEFT, and RIGHT periureteral stranding suggesting infection. 2. Nonobstructing RIGHT renal calculus and increased density/calcification within bilateral renal medullary regions suggesting medullary sponge kidney. 3. Unchanged 10 x 12 cm LEFT ovarian/adnexal cyst worrisome for cystic neoplasm. Again gyn consultation recommended. 4. Cirrhosis and mild splenomegaly. Electronically Signed   By: Margarette Canada M.D.   On: 02/03/2018 19:19   Ct Renal Stone Study  Result Date: 02/01/2018 CLINICAL DATA:  Right-sided flank pain EXAM: CT ABDOMEN AND PELVIS WITHOUT CONTRAST TECHNIQUE: Multidetector CT imaging of the abdomen and pelvis was performed following the standard protocol without IV contrast. COMPARISON:  09/13/2016 FINDINGS: Lower chest: Lung bases demonstrate no acute consolidation or effusion. Heart size within normal limits. Hepatobiliary: Nodular liver contour consistent with cirrhosis. No calcified stones or biliary dilatation. Pancreas: Unremarkable. No pancreatic ductal dilatation or surrounding inflammatory changes. Spleen: Slightly enlarged at 15 cm Adrenals/Urinary Tract: Adrenal glands are within normal limits. Appearance of nephrocalcinosis. Punctate form stone upper pole right kidney. Mild right hydronephrosis and hydroureter, but no definitive stones seen along the course of the right ureter. The bladder is normal Stomach/Bowel: Stomach is nonenlarged. No dilated small bowel. Negative appendix. Vascular/Lymphatic: Nonaneurysmal aorta. Mild retroperitoneal nodes. Reproductive: Uterus within normal limits. Interval enlargement of left ovarian cystic mass, now measuring 10.7 cm. Other: No free air or free fluid. Musculoskeletal: No acute or significant osseous findings. IMPRESSION: 1. Mild right hydronephrosis and hydroureter but no definitive ureteral  stone is  seen, question recently passed stone. 2. Increased density within the central kidneys bilaterally suggesting nephrocalcinosis. Small stone in the upper pole right kidney. 3. Cirrhotic appearance of liver with splenomegaly 4. 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 Suggest gynecologic surgical consult. Electronically Signed   By: Donavan Foil M.D.   On: 02/01/2018 21:35    EKG: No orders found for this or any previous visit.  IMPRESSION AND PLAN: Pt is 37 y/o recent diagnosis of pyelonephritis presenting back with fever chills  1.  Acute pyelonephritis with recent CT suggestive of passage of stone and hydronephrosis due to patient failing oral antibiotics I will repeat a CT scan per renal protocol We will treat with IV antibiotics follow urine cultures and blood cultures  2.  Acute migraine attack we will do Toradol as needed Imitrex as needed  3.  Borderline personality disorder committed continue Lamictal  4.  Miscellaneous Lovenox for DVT prophylaxis  All the records are reviewed and case discussed with ED provider. Management plans discussed with the patient, family and they are in agreement.  CODE STATUS:full code    TOTAL TIME TAKING CARE OF THIS PATIENT: 55 minutes.    Dustin Flock M.D on 02/03/2018 at 7:37 PM  Between 7am to 6pm - Pager - [phone]  After 6pm go to [email or web address] - password [email or web address] [facility]  [employer] [facility]  [phone]  CC: Primary care physician; Patient, No Pcp Per

## 2018-02-03 NOTE — ED Notes (Signed)
ED Provider at bedside. 

## 2018-02-03 NOTE — ED Notes (Signed)
Pt given ice chips, MD aware.  

## 2018-02-03 NOTE — ED Notes (Signed)
[place], from Lab verified that lab would be able to add on urine culture to urine sent down previously.

## 2018-02-04 MED ORDER — MORPHINE SULFATE (PF) 2 MG/ML IV SOLN
2.0000 mg | INTRAVENOUS | Status: DC | PRN
  Administered 2018-02-05: 2 mg via INTRAVENOUS
  Filled 2018-02-04: qty 1

## 2018-02-04 MED ORDER — POLYETHYLENE GLYCOL 3350 17 G PO PACK
17.0000 g | PACK | Freq: Every day | ORAL | Status: DC
  Administered 2018-02-04: 17 g via ORAL
  Filled 2018-02-04: qty 1

## 2018-02-04 MED ORDER — DOCUSATE SODIUM 100 MG PO CAPS
100.0000 mg | ORAL_CAPSULE | Freq: Two times a day (BID) | ORAL | Status: DC
  Administered 2018-02-04 – 2018-02-05 (×3): 100 mg via ORAL
  Filled 2018-02-04 (×3): qty 1

## 2018-02-04 MED ORDER — SUMATRIPTAN SUCCINATE 50 MG PO TABS
25.0000 mg | ORAL_TABLET | Freq: Four times a day (QID) | ORAL | Status: DC | PRN
  Administered 2018-02-04 – 2018-02-05 (×3): 25 mg via ORAL
  Filled 2018-02-04 (×4): qty 1

## 2018-02-04 NOTE — Progress Notes (Signed)
   02/04/18 1855  Clinical Encounter Type  Visited With Patient and family together  Visit Type Follow-up  Recommendations follow up morning of 02/05/18   Chaplain followed up with patient based on earlier conversation.  Patient's spouse [name] at bedside. Patient reported that she had just been given Vicodin which made her sleepy.  She would appreciate a chaplain follow up tomorrow morning as early as possible.

## 2018-02-04 NOTE — ED Provider Notes (Signed)
[facility] Emergency Department Provider Note  ____________________________________________   I have reviewed the triage vital signs and the nursing notes. Where available I have reviewed prior notes and, if possible and indicated, outside hospital notes.    HISTORY  Chief Complaint Migraine and Chills    HPI Abigail Cunningham is a 37 y.o. female  Return to the emergency room stating that her urinary tract infections to going on she still has flank pain.  She has had fevers at home she believes.  She also feels just generally unwell.  Has had some vomiting.  She denies any abdominal pain.  She denies any fever chills.  She does have a history of migraines and states that whenever she gets sick she has a migraine she is having a migraine which is mild today.  She states "it is starting".  Patient was given antibiotics at home, no culture was sent.  She states that she still is urinary symptoms spite compliance with the medications.   Past Medical History:  Diagnosis Date  . Asthma   . Borderline personality disorder ([place])   . Bronchitis unk  . Chronic hepatitis ([place])   . Generalized OA   . Immune deficiency disorder ([place])    chronic autoimmune hepatitis per pt  . Kidney stone   . Migraine     Patient Active Problem List   Diagnosis Date Noted  . Acute pyelonephritis 02/03/2018    Past Surgical History:  Procedure Laterality Date  . ABDOMINAL HYSTERECTOMY    . LITHOTRIPSY    . OVARIAN CYST REMOVAL      Prior to Admission medications   Medication Sig Start Date End Date Taking? Authorizing Provider  benzonatate (TESSALON PERLES) 100 MG capsule Take 1 capsule (100 mg total) by mouth 3 (three) times daily as needed for cough. 02/07/17 02/07/18  Laban Emperor, PA-C  brompheniramine-pseudoephedrine-DM 30-2-10 MG/5ML syrup Take 5 mLs by mouth 4 (four) times daily as needed. 07/04/17   Sable Feil, PA-C  ciprofloxacin (CIPRO) 500 MG tablet Take 1  tablet (500 mg total) by mouth 2 (two) times daily for 10 days. 02/01/18 02/11/18  Earleen Newport, MD  fluticasone (FLONASE) 50 MCG/ACT nasal spray Place 2 sprays into both nostrils daily. 02/07/17 02/07/18  Laban Emperor, PA-C  HYDROcodone-acetaminophen (NORCO/VICODIN) 5-325 MG per tablet Take 1 tablet by mouth every 4 (four) hours as needed for moderate pain. Patient not taking: Reported on 04/22/2016 10/23/14   Sherrie George B, FNP  ibuprofen (ADVIL,MOTRIN) 600 MG tablet Take 1 tablet (600 mg total) by mouth every 8 (eight) hours as needed. 07/04/17   Sable Feil, PA-C  lamoTRIgine (LAMICTAL) 25 MG tablet Take 50 mg by mouth 2 (two) times daily. Take 25 mg daily week 1; 25 mg twice daily week 2; 50 mg in the morning and 25 mg at night week 3; 50 mg twice daily week 4. 08/18/16   [provider]  meloxicam (MOBIC) 15 MG tablet Take 1 tablet (15 mg total) by mouth daily. Patient not taking: Reported on 04/22/2016 10/23/14   Sherrie George B, FNP  metroNIDAZOLE (FLAGYL) 500 MG tablet Take 1 tablet (500 mg total) by mouth 2 (two) times daily. 02/01/18   Earleen Newport, MD  ondansetron (ZOFRAN ODT) 4 MG disintegrating tablet Take 1 tablet (4 mg total) by mouth every 8 (eight) hours as needed for nausea or vomiting. 09/13/16   Eula Listen, MD  oxyCODONE-acetaminophen (PERCOCET) 5-325 MG tablet Take 1 tablet by  mouth every 6 (six) hours as needed. 02/01/18   Earleen Newport, MD  phenazopyridine (PYRIDIUM) 200 MG tablet Take 1 tablet (200 mg total) by mouth 3 (three) times daily as needed for pain. 02/01/18 02/01/19  Earleen Newport, MD  sulfamethoxazole-trimethoprim (BACTRIM DS,SEPTRA DS) 800-160 MG tablet Take 1 tablet by mouth 2 (two) times daily. 02/07/17   Laban Emperor, PA-C    Allergies Haldol [haloperidol lactate]  Family History  Adopted: Yes    Social History Social History   Tobacco Use  . Smoking status: Current Some Day Smoker  . Smokeless tobacco: Never  Used  Substance Use Topics  . Alcohol use: No  . Drug use: Never    Review of Systems Constitutional: No fever/chills Eyes: No visual changes. ENT: No sore throat. No stiff neck no neck pain Cardiovascular: Denies chest pain. Respiratory: Denies shortness of breath. Gastrointestinal:   See HPI Genitourinary: +for dysuria. Musculoskeletal: Negative lower extremity swelling Skin: Negative for rash. Neurological: Negative for severe headaches, focal weakness or numbness.   ____________________________________________   PHYSICAL EXAM:  VITAL SIGNS: ED Triage Vitals  Enc Vitals Group     BP 02/03/18 1358 113/70     Pulse Rate 02/03/18 1358 (!) 112     Resp 02/03/18 1358 20     Temp 02/03/18 1358 99.5 F (37.5 C)     Temp Source 02/03/18 1358 Oral     SpO2 02/03/18 1358 99 %     Weight 02/03/18 1359 184 lb 15.5 oz (83.9 kg)     Height 02/03/18 1359 5\' 3"  (1.6 m)     Head Circumference --      Peak Flow --      Pain Score 02/03/18 1359 9     Pain Loc --      Pain Edu? --      Excl. in [place]? --     Constitutional: Alert and oriented. Well appearing and in no acute distress.  This is that she does not feel well but she is not toxic Eyes: Conjunctivae are normal Head: Atraumatic HEENT: No congestion/rhinnorhea. Mucous membranes are moist.  Oropharynx non-erythematous Neck:   Nontender with no meningismus, no masses, no stridor Cardiovascular: Normal rate, regular rhythm. Grossly normal heart sounds.  Good peripheral circulation. Respiratory: Normal respiratory effort.  No retractions. Lungs CTAB. Abdominal: Soft and nontender. No distention. No guarding no rebound Back:  There is no focal tenderness or step off.  there is no midline tenderness there are no lesions noted. there is mild left CVA tenderness  Musculoskeletal: No lower extremity tenderness, no upper extremity tenderness. No joint effusions, no DVT signs strong distal pulses no edema Neurologic:  Normal speech  and language. No gross focal neurologic deficits are appreciated.  Skin:  Skin is warm, dry and intact. No rash noted. Psychiatric: Mood and affect are normal. Speech and behavior are normal.  ____________________________________________   LABS (all labs ordered are listed, but only abnormal results are displayed)  Labs Reviewed  COMPREHENSIVE METABOLIC PANEL - Abnormal; Notable for the following components:      Result Value   Glucose, Bld 166 (*)    All other components within normal limits  CBC WITH DIFFERENTIAL/PLATELET - Abnormal; Notable for the following components:   Platelets 137 (*)    All other components within normal limits  URINALYSIS, COMPLETE (UACMP) WITH MICROSCOPIC - Abnormal; Notable for the following components:   Color, Urine AMBER (*)    APPearance CLEAR (*)  Protein, ur 30 (*)    Nitrite POSITIVE (*)    Bacteria, UA FEW (*)    All other components within normal limits  CULTURE, BLOOD (ROUTINE X 2)  CULTURE, BLOOD (ROUTINE X 2)  URINE CULTURE  LACTIC ACID, PLASMA  PROTIME-INR  TSH  HIV ANTIBODY (ROUTINE TESTING W REFLEX)    Pertinent labs  results that were available during my care of the patient were reviewed by me and considered in my medical decision making (see chart for details). ____________________________________________  EKG  I personally interpreted any EKGs ordered by me or triage  ____________________________________________  RADIOLOGY  Pertinent labs & imaging results that were available during my care of the patient were reviewed by me and considered in my medical decision making (see chart for details). If possible, patient and/or family made aware of any abnormal findings.  No results found. ____________________________________________    PROCEDURES  Procedure(s) performed: None  Procedures  Critical Care performed: None  ____________________________________________   INITIAL IMPRESSION / ASSESSMENT AND PLAN / ED  COURSE  Pertinent labs & imaging results that were available during my care of the patient were reviewed by me and considered in my medical decision making (see chart for details).  Patient here with ongoing urinary symptoms despite home antibiotics, positive with nitrites still, some flank pain, does not feel well enough to go home, we will admit for further treatment.    ____________________________________________   FINAL CLINICAL IMPRESSION(S) / ED DIAGNOSES  Final diagnoses:  Pyelonephritis      This chart was dictated using voice recognition software.  Despite best efforts to proofread,  errors can occur which can change meaning.      Schuyler Amor, MD 02/04/18 2223

## 2018-02-04 NOTE — Progress Notes (Signed)
   02/04/18 1030  Clinical Encounter Type  Visited With Patient  Visit Type Initial (order for advanced directives)  Referral From Nurse  Consult/Referral To Chaplain   Patient reported that she was currently experiencing a migraine.  She requested that chaplain leave the advanced directive document and return later today for conversation.  Chaplain complied and will follow up later this afternoon.

## 2018-02-04 NOTE — Progress Notes (Signed)
[facility] [place] NAME: Abigail Cunningham    MR#:  [id number]  DATE OF BIRTH:  04/15/1981  SUBJECTIVE:  CHIEF COMPLAINT:   Chief Complaint  Patient presents with  . Migraine  . Chills  Patient seen and evaluated today No new episodes of fever No complaints of flank pain Has some headache on and off  REVIEW OF SYSTEMS:    ROS  CONSTITUTIONAL: No documented fever. No fatigue, weakness. No weight gain, no weight loss.  EYES: No blurry or double vision.  ENT: No tinnitus. No postnasal drip. No redness of the oropharynx.  RESPIRATORY: No cough, no wheeze, no hemoptysis. No dyspnea.  CARDIOVASCULAR: No chest pain. No orthopnea. No palpitations. No syncope.  GASTROINTESTINAL: No nausea, no vomiting or diarrhea. No abdominal pain. No melena or hematochezia.  GENITOURINARY: No dysuria or hematuria.  Decreased flank pain ENDOCRINE: No polyuria or nocturia. No heat or cold intolerance.  HEMATOLOGY: No anemia. No bruising. No bleeding.  INTEGUMENTARY: No rashes. No lesions.  MUSCULOSKELETAL: No arthritis. No swelling. No gout.  NEUROLOGIC: No numbness, tingling, or ataxia. No seizure-type activity.  Headache on and off PSYCHIATRIC: No anxiety. No insomnia. No ADD.   DRUG ALLERGIES:   Allergies  Allergen Reactions  . Haldol [Haloperidol Lactate] Other (See Comments)    Shuts down 'system'    VITALS:  Blood pressure 121/83, pulse 72, temperature 98.7 F (37.1 C), temperature source Oral, resp. rate 17, height 5\' 3"  (1.6 m), weight 83.9 kg, last menstrual period 01/18/2018, SpO2 98 %.  PHYSICAL EXAMINATION:   Physical Exam  GENERAL:  37 y.o.-year-old patient lying in the bed with no acute distress.  EYES: Pupils equal, round, reactive to light and accommodation. No scleral icterus. Extraocular muscles intact.  HEENT: Head atraumatic, normocephalic. Oropharynx and nasopharynx clear.  NECK:  Supple, no jugular venous distention. No  thyroid enlargement, no tenderness.  LUNGS: Normal breath sounds bilaterally, no wheezing, rales, rhonchi. No use of accessory muscles of respiration.  CARDIOVASCULAR: S1, S2 normal. No murmurs, rubs, or gallops.  ABDOMEN: Soft, nontender, nondistended. Bowel sounds present. No organomegaly or mass.  Decreased CV angle tenderness EXTREMITIES: No cyanosis, clubbing or edema b/l.    NEUROLOGIC: Cranial nerves II through XII are intact. No focal Motor or sensory deficits b/l.   PSYCHIATRIC: The patient is alert and oriented x 3.  SKIN: No obvious rash, lesion, or ulcer.   LABORATORY PANEL:   CBC Recent Labs  Lab 02/03/18 1412  WBC 7.2  HGB 13.4  HCT 38.5  PLT 137*   ------------------------------------------------------------------------------------------------------------------ Chemistries  Recent Labs  Lab 02/03/18 1412  NA 137  K 3.8  CL 106  CO2 23  GLUCOSE 166*  BUN 8  CREATININE 0.66  CALCIUM 9.1  AST 20  ALT 21  ALKPHOS 57  BILITOT 0.6   ------------------------------------------------------------------------------------------------------------------  Cardiac Enzymes No results for input(s): TROPONINI in the last 168 hours. ------------------------------------------------------------------------------------------------------------------  RADIOLOGY:  Ct Renal Stone Study  Result Date: 02/03/2018 CLINICAL DATA:  37 year old female with acute abdominal and flank pain, sweats and chills. EXAM: CT ABDOMEN AND PELVIS WITHOUT CONTRAST TECHNIQUE: Multidetector CT imaging of the abdomen and pelvis was performed following the standard protocol without IV contrast. COMPARISON:  02/01/2018 and prior CTs FINDINGS: Please note that parenchymal abnormalities may be missed without intravenous contrast. Lower chest: No acute abnormality Hepatobiliary: Nodular hepatic contour is compatible with cirrhosis. No focal hepatic abnormalities identified on this noncontrast study. The  gallbladder is unremarkable.  No biliary dilatation. Pancreas: Unremarkable Spleen: Mild splenomegaly again noted. Adrenals/Urinary Tract: Bilateral perinephric stranding, RIGHT greater than LEFT, and RIGHT periureteral stranding again noted. 2 mm nonobstructing RIGHT UPPER pole renal calculus again noted. Increased density/calcifications in the medullary regions of both kidneys are present and suggestive of medullary sponge kidney. No hydronephrosis or obstructing urinary calculi identified. The bladder and adrenal glands are unremarkable. Stomach/Bowel: Stomach is within normal limits. Appendix appears normal. No evidence of bowel wall thickening, distention, or inflammatory changes. Vascular/Lymphatic: No significant vascular findings are present. No enlarged abdominal or pelvic lymph nodes. Reproductive: A 10 x 12 cm LEFT ovarian/adnexal cyst is again noted. No other interval change. Other: No ascites or pneumoperitoneum. Musculoskeletal: No acute or suspicious bony abnormalities. IMPRESSION: 1. Unchanged bilateral perinephric stranding, RIGHT greater and LEFT, and RIGHT periureteral stranding suggesting infection. 2. Nonobstructing RIGHT renal calculus and increased density/calcification within bilateral renal medullary regions suggesting medullary sponge kidney. 3. Unchanged 10 x 12 cm LEFT ovarian/adnexal cyst worrisome for cystic neoplasm. Again gyn consultation recommended. 4. Cirrhosis and mild splenomegaly. Electronically Signed   By: Margarette Canada M.D.   On: 02/03/2018 19:19     ASSESSMENT AND PLAN:  37 year old female patient with history of bronchial asthma, autoimmune hepatitis, migraine currently under hospitalist service for flank pain and urinary tract infection  -Acute pyelonephritis Continue IV antibiotic Follow blood and urine cultures Continue IV Rocephin antibiotic  -Migraine attacks Start sumatriptan   -Borderline personality disorder Continue Lamictal  -DVT prophylaxis subcu  Lovenox daily   All the records are reviewed and case discussed with Care Management/Social Worker. Management plans discussed with the patient, family and they are in agreement.  CODE STATUS: Partial code  DVT Prophylaxis: SCDs  TOTAL TIME TAKING CARE OF THIS PATIENT: 34 minutes.   POSSIBLE D/C IN 1 to 2 DAYS, DEPENDING ON CLINICAL CONDITION.  Saundra Shelling M.D on 02/04/2018 at 11:25 AM  Between 7am to 6pm - Pager - [phone]  After 6pm go to [email or web address] - password [email or web address] [facility]  Office  [phone]  CC: Primary care physician; Patient, No Pcp Per  Note: This dictation was prepared with Dragon dictation along with smaller phrase technology. Any transcriptional errors that result from this process are unintentional.

## 2018-02-04 NOTE — Progress Notes (Signed)
Advanced care plan. Purpose of the Encounter: CODE STATUS Parties in Attendance: Patient Patient's Decision Capacity: Good present Subjective/Patient's story: Presented to the emergency room for chills Objective/Medical story Patient was evaluated in the emergency room CT scan showed acute pyelonephritis Needs IV antibiotics and cultures Goals of care determination:  Advance care directives and goals of care discussed Patient wants CPR and cardiac resuscitation but does not want any intubation or ventilator if the need arises CODE STATUS: Partial code Time spent discussing advanced care planning: 16 minutes

## 2018-02-05 LAB — URINE CULTURE: CULTURE: NO GROWTH

## 2018-02-05 MED ORDER — SUMATRIPTAN SUCCINATE 25 MG PO TABS: 25.0000 mg | ORAL_TABLET | Freq: Three times a day (TID) | ORAL | 0 refills | Status: DC | PRN

## 2018-02-05 MED ORDER — PHENAZOPYRIDINE HCL 200 MG PO TABS: 200.0000 mg | ORAL_TABLET | Freq: Three times a day (TID) | ORAL | 0 refills | Status: DC | PRN

## 2018-02-05 NOTE — Discharge Instructions (Signed)
Pyelonephritis, Adult Pyelonephritis is a kidney infection. The kidneys are the organs that filter a person's blood and move waste out of the bloodstream and into the urine. Urine passes from the kidneys, through the ureters, and into the bladder. There are two main types of pyelonephritis:  Infections that come on quickly without any warning (acute pyelonephritis).  Infections that last for a long period of time (chronic pyelonephritis).  In most cases, the infection clears up with treatment and does not cause further problems. More severe infections or chronic infections can sometimes spread to the bloodstream or lead to other problems with the kidneys. What are the causes? This condition is usually caused by:  Bacteria traveling from the bladder to the kidney through infected urine. The urine in the bladder can become infected with bacteria from: ? Bladder infection (cystitis). ? Inflammation of the prostate gland (prostatitis). ? Sexual intercourse, in females.  Bacteria traveling from the bloodstream to the kidney.  What increases the risk? This condition is more likely to develop in:  Pregnant women.  Older people.  People who have diabetes.  People who have kidney stones or bladder stones.  People who have other abnormalities of the kidney or ureter.  People who have a catheter placed in the bladder.  People who have cancer.  People who are sexually active.  Women who use spermicides.  People who have had a prior urinary tract infection.  What are the signs or symptoms? Symptoms of this condition include:  Frequent urination.  Strong or persistent urge to urinate.  Burning or stinging when urinating.  Abdominal pain.  Back pain.  Pain in the side or flank area.  Fever.  Chills.  Blood in the urine, or dark urine.  Nausea.  Vomiting.  How is this diagnosed? This condition may be diagnosed based on:  Medical history and physical exam.  Urine  tests.  Blood tests.  You may also have imaging tests of the kidneys, such as an ultrasound or CT scan. How is this treated? Treatment for this condition may depend on the severity of the infection.  If the infection is mild and is found early, you may be treated with antibiotic medicines taken by mouth. You will need to drink fluids to remain hydrated.  If the infection is more severe, you may need to stay in the hospital and receive antibiotics given directly into a vein through an IV tube. You may also need to receive fluids through an IV tube if you are not able to remain hydrated. After your hospital stay, you may need to take oral antibiotics for a period of time.  Other treatments may be required, depending on the cause of the infection. Follow these instructions at home: Medicines  Take over-the-counter and prescription medicines only as told by your health care provider.  If you were prescribed an antibiotic medicine, take it as told by your health care provider. Do not stop taking the antibiotic even if you start to feel better. General instructions  Drink enough fluid to keep your urine clear or pale yellow.  Avoid caffeine, tea, and carbonated beverages. They tend to irritate the bladder.  Urinate often. Avoid holding in urine for long periods of time.  Urinate before and after sex.  After a bowel movement, women should cleanse from front to back. Use each tissue only once.  Keep all follow-up visits as told by your health care provider. This is important. Contact a health care provider if:  Your symptoms   do not get better after 2 days of treatment.  Your symptoms get worse.  You have a fever. Get help right away if:  You are unable to take your antibiotics or fluids.  You have shaking chills.  You vomit.  You have severe flank or back pain.  You have extreme weakness or fainting. This information is not intended to replace advice given to you by your  health care provider. Make sure you discuss any questions you have with your health care provider. Document Released: 05/02/2005 Document Revised: 10/08/2015 Document Reviewed: 08/25/2014 Elsevier Interactive Patient Education  2018 [employer]vier Inc.  

## 2018-02-05 NOTE — Progress Notes (Signed)
   02/05/18 1100  Clinical Encounter Type  Visited With Patient and family together  Visit Type Follow-up (AD education and creation.)  Recommendations Follow-up, as requested.   Chaplain followed up on previous visits to provide AD education. Patient was medicated again and did not wish to pursue completion of the AD at this time. Patient requested prayer, which Chaplain provided. Chaplain instructed the patient to have the on-call chaplain paged if she wanted to complete and notarize the AD.

## 2018-02-05 NOTE — Discharge Summary (Signed)
[facility] NAME: Abigail Cunningham    MR#:  [id number]  DATE OF BIRTH:  09/01/1980  DATE OF ADMISSION:  02/03/2018 ADMITTING PHYSICIAN: Dustin Flock, MD  DATE OF DISCHARGE: 02/05/2018 12:24 PM  PRIMARY CARE PHYSICIAN: Patient, No Pcp Per   ADMISSION DIAGNOSIS:  Pyelonephritis [N12] Migraine Borderline personality disorder DISCHARGE DIAGNOSIS:  Active Problems:   Acute pyelonephritis Headache secondary to migraine Borderline personality disorder  SECONDARY DIAGNOSIS:   Past Medical History:  Diagnosis Date  . Asthma   . Borderline personality disorder ([place])   . Bronchitis unk  . Chronic hepatitis ([place])   . Generalized OA   . Immune deficiency disorder ([place])    chronic autoimmune hepatitis per pt  . Kidney stone   . Migraine      ADMITTING HISTORY Abigail Cunningham  is a 37 y.o. female with a known history of asthma, autoimmune hepatitis, migraine who was seen in the emergency room 2 days ago with complaint of flank pain and was noted to have pyelonephritis who is presenting back with severe chills pain all over as well as migraines.  Patient states that she did take her antibiotics as prescribed however she is continued to feel bad.  Currently complains of severe migraine.  Pain all over.  HOSPITAL COURSE:  Patient was admitted to medical floor.  Patient received IV fluids and IV Rocephin antibiotic for acute pyelonephritis.  Urine cultures did not reveal any growth.  Blood cultures also did not reveal any growth.  Fever and chills completely resolved.  Flank pain also resolved.  Patient tolerated diet well.  Patient will be discharged home.  CONSULTS OBTAINED:  None  DRUG ALLERGIES:   Allergies  Allergen Reactions  . Haldol [Haloperidol Lactate] Other (See Comments)    Shuts down 'system'    DISCHARGE MEDICATIONS:   Allergies as of 02/05/2018      Reactions   Haldol [haloperidol Lactate] Other (See  Comments)   Shuts down 'system'      Medication List    STOP taking these medications   HYDROcodone-acetaminophen 5-325 MG tablet Commonly known as:  NORCO/VICODIN   meloxicam 15 MG tablet Commonly known as:  MOBIC   metroNIDAZOLE 500 MG tablet Commonly known as:  FLAGYL   sulfamethoxazole-trimethoprim 800-160 MG tablet Commonly known as:  BACTRIM DS,SEPTRA DS     TAKE these medications   benzonatate 100 MG capsule Commonly known as:  TESSALON Take 1 capsule (100 mg total) by mouth 3 (three) times daily as needed for cough.   brompheniramine-pseudoephedrine-DM 30-2-10 MG/5ML syrup Take 5 mLs by mouth 4 (four) times daily as needed.   ciprofloxacin 500 MG tablet Commonly known as:  CIPRO Take 1 tablet (500 mg total) by mouth 2 (two) times daily for 10 days.   fluticasone 50 MCG/ACT nasal spray Commonly known as:  FLONASE Place 2 sprays into both nostrils daily.   ibuprofen 600 MG tablet Commonly known as:  ADVIL,MOTRIN Take 1 tablet (600 mg total) by mouth every 8 (eight) hours as needed.   lamoTRIgine 25 MG tablet Commonly known as:  LAMICTAL Take 50 mg by mouth 2 (two) times daily. Take 25 mg daily week 1; 25 mg twice daily week 2; 50 mg in the morning and 25 mg at night week 3; 50 mg twice daily week 4.   ondansetron 4 MG disintegrating tablet Commonly known as:  ZOFRAN-ODT Take 1 tablet (4 mg total) by mouth every 8 (eight) hours  as needed for nausea or vomiting.   oxyCODONE-acetaminophen 5-325 MG tablet Commonly known as:  PERCOCET/ROXICET Take 1 tablet by mouth every 6 (six) hours as needed.   phenazopyridine 200 MG tablet Commonly known as:  PYRIDIUM Take 1 tablet (200 mg total) by mouth 3 (three) times daily as needed for pain. Notes to patient:  Drink plenty of fluids with this medication.    SUMAtriptan 25 MG tablet Commonly known as:  IMITREX Take 1 tablet (25 mg total) by mouth every 8 (eight) hours as needed for migraine or headache. May repeat  in 2 hours if headache persists or recurs.       Today  Patient seen and evaluated today Tolerating diet well No abdominal and flank pain No fever and chills  VITAL SIGNS:  Blood pressure 108/72, pulse 68, temperature 98.5 F (36.9 C), temperature source Oral, resp. rate 18, height 5\' 3"  (1.6 m), weight 83.9 kg, last menstrual period 01/18/2018, SpO2 100 %.  I/O:    Intake/Output Summary (Last 24 hours) at 02/05/2018 1442 Last data filed at 02/05/2018 1112 Gross per 24 hour  Intake 2456.79 ml  Output 300 ml  Net 2156.79 ml    PHYSICAL EXAMINATION:  Physical Exam  GENERAL:  37 y.o.-year-old patient lying in the bed with no acute distress.  LUNGS: Normal breath sounds bilaterally, no wheezing, rales,rhonchi or crepitation. No use of accessory muscles of respiration.  CARDIOVASCULAR: S1, S2 normal. No murmurs, rubs, or gallops.  ABDOMEN: Soft, non-tender, non-distended. Bowel sounds present. No organomegaly or mass.  NEUROLOGIC: Moves all 4 extremities. PSYCHIATRIC: The patient is alert and oriented x 3.  SKIN: No obvious rash, lesion, or ulcer.   DATA REVIEW:   CBC Recent Labs  Lab 02/03/18 1412  WBC 7.2  HGB 13.4  HCT 38.5  PLT 137*    Chemistries  Recent Labs  Lab 02/03/18 1412  NA 137  K 3.8  CL 106  CO2 23  GLUCOSE 166*  BUN 8  CREATININE 0.66  CALCIUM 9.1  AST 20  ALT 21  ALKPHOS 57  BILITOT 0.6    Cardiac Enzymes No results for input(s): TROPONINI in the last 168 hours.  Microbiology Results  Results for orders placed or performed during the hospital encounter of 02/03/18  Culture, blood (Routine x 2)     Status: None (Preliminary result)   Collection Time: 02/03/18  2:17 PM  Result Value Ref Range Status   Specimen Description BLOOD LEFT ANTECUBITAL  Final   Special Requests   Final    BOTTLES DRAWN AEROBIC AND ANAEROBIC Blood Culture results may not be optimal due to an excessive volume of blood received in culture bottles   Culture    Final    NO GROWTH 2 DAYS Performed at [facility], [address] [place], [place] [address]    Report Status PENDING  Incomplete  Culture, blood (Routine x 2)     Status: None (Preliminary result)   Collection Time: 02/03/18  2:17 PM  Result Value Ref Range Status   Specimen Description BLOOD BLOOD RIGHT HAND  Final   Special Requests   Final    BOTTLES DRAWN AEROBIC AND ANAEROBIC Blood Culture adequate volume   Culture   Final    NO GROWTH 2 DAYS Performed at [facility], [address], [place], [place] [address]    Report Status PENDING  Incomplete  Urine culture     Status: None   Collection Time: 02/03/18  4:32  PM  Result Value Ref Range Status   Specimen Description   Final    URINE, RANDOM Performed at [facility], [address] [place], [place] [address]    Special Requests   Final    NONE Performed at [facility], [address], [place], [place] [address]    Culture   Final    NO GROWTH Performed at [facility], [place] [address], [place], [place] [address]    Report Status 02/05/2018 FINAL  Final    RADIOLOGY:  Ct Renal Stone Study  Result Date: 02/03/2018 CLINICAL DATA:  37 year old female with acute abdominal and flank pain, sweats and chills. EXAM: CT ABDOMEN AND PELVIS WITHOUT CONTRAST TECHNIQUE: Multidetector CT imaging of the abdomen and pelvis was performed following the standard protocol without IV contrast. COMPARISON:  02/01/2018 and prior CTs FINDINGS: Please note that parenchymal abnormalities may be missed without intravenous contrast. Lower chest: No acute abnormality Hepatobiliary: Nodular hepatic contour is compatible with cirrhosis. No focal hepatic abnormalities identified on this noncontrast study. The gallbladder is unremarkable. No biliary dilatation. Pancreas: Unremarkable Spleen: Mild splenomegaly again noted. Adrenals/Urinary Tract: Bilateral perinephric stranding, RIGHT greater than LEFT, and  RIGHT periureteral stranding again noted. 2 mm nonobstructing RIGHT UPPER pole renal calculus again noted. Increased density/calcifications in the medullary regions of both kidneys are present and suggestive of medullary sponge kidney. No hydronephrosis or obstructing urinary calculi identified. The bladder and adrenal glands are unremarkable. Stomach/Bowel: Stomach is within normal limits. Appendix appears normal. No evidence of bowel wall thickening, distention, or inflammatory changes. Vascular/Lymphatic: No significant vascular findings are present. No enlarged abdominal or pelvic lymph nodes. Reproductive: A 10 x 12 cm LEFT ovarian/adnexal cyst is again noted. No other interval change. Other: No ascites or pneumoperitoneum. Musculoskeletal: No acute or suspicious bony abnormalities. IMPRESSION: 1. Unchanged bilateral perinephric stranding, RIGHT greater and LEFT, and RIGHT periureteral stranding suggesting infection. 2. Nonobstructing RIGHT renal calculus and increased density/calcification within bilateral renal medullary regions suggesting medullary sponge kidney. 3. Unchanged 10 x 12 cm LEFT ovarian/adnexal cyst worrisome for cystic neoplasm. Again gyn consultation recommended. 4. Cirrhosis and mild splenomegaly. Electronically Signed   By: Margarette Canada M.D.   On: 02/03/2018 19:19    Follow up with PCP in 1 week.  Management plans discussed with the patient, family and they are in agreement.  CODE STATUS: Full code    Code Status Orders  (From admission, onward)         Start     Ordered   02/04/18 1123  Limited resuscitation (code)  Continuous    Question Answer Comment  In the event of cardiac or respiratory ARREST: Initiate Code Blue, Call Rapid Response Yes   In the event of cardiac or respiratory ARREST: Perform CPR Yes   In the event of cardiac or respiratory ARREST: Perform Intubation/Mechanical Ventilation No   In the event of cardiac or respiratory ARREST: Use NIPPV/BiPAp only if  indicated Yes   In the event of cardiac or respiratory ARREST: Administer ACLS medications if indicated Yes   In the event of cardiac or respiratory ARREST: Perform Defibrillation or Cardioversion if indicated Yes      02/04/18 1123        Code Status History    Date Active Date Inactive Code Status Order ID Comments User Context   02/03/2018 1958 02/04/2018 1122 Full Code [id number]  Dustin Flock, MD Inpatient      TOTAL TIME TAKING CARE OF THIS PATIENT  ON DAY OF DISCHARGE: more than 34 minutes.   Saundra Shelling M.D on 02/05/2018 at 2:42 PM  Between 7am to 6pm - Pager - [phone]  After 6pm go to [email or web address] - password [email or web address] [facility]  Office  [phone]  CC: Primary care physician; Patient, No Pcp Per  Note: This dictation was prepared with Dragon dictation along with smaller phrase technology. Any transcriptional errors that result from this process are unintentional.

## 2018-02-05 NOTE — Progress Notes (Signed)
[facility] at [place] was admitted to the Hospital on 02/03/2018 and Discharged  02/05/2018 and should be excused from work until 02/07/2018    Call Saundra Shelling MD with questions.  Saundra Shelling M.D on 02/05/2018,at 10:58 AM  [place] [facility]  [phone]

## 2018-02-05 NOTE — Progress Notes (Addendum)
Patient discharging per MD order. Patient has been given all belongings, doctors notes, discharge instructions and new prescriptions. Patient understands discharge instructions and will leave with son by private vehicle.

## 2018-02-06 LAB — HIV ANTIBODY (ROUTINE TESTING W REFLEX): HIV Screen 4th Generation wRfx: NONREACTIVE

## 2018-02-08 ENCOUNTER — Telehealth: Payer: Self-pay

## 2018-02-08 LAB — CULTURE, BLOOD (ROUTINE X 2)
CULTURE: NO GROWTH
Culture: NO GROWTH
Special Requests: ADEQUATE

## 2018-02-08 NOTE — Telephone Encounter (Signed)
Flagged on EMMI report for having wrong number listed. Attempted to call cell listed on chart, however number was disconnected.  Called home number and spoke with patient's husband who relayed correct cell number for patient. Reached patient at cell [phone] and explained the purpose of the EMMI callbacks and to see if she would like to update her number in the system to receive the second call.  She was in agreement with this.  Cell number updated in her chart and ticket submitted to [employer] to update their system.  Patient mentioned that she has no questions or concerns at this time. I thanked her for her time.

## 2019-02-19 ENCOUNTER — Other Ambulatory Visit: Payer: Self-pay

## 2019-02-19 DIAGNOSIS — Z20822 Contact with and (suspected) exposure to covid-19: Secondary | ICD-10-CM

## 2019-02-21 LAB — NOVEL CORONAVIRUS, NAA: SARS-CoV-2, NAA: NOT DETECTED

## 2019-08-06 ENCOUNTER — Emergency Department: Payer: Managed Care, Other (non HMO)

## 2019-08-06 ENCOUNTER — Other Ambulatory Visit: Payer: Self-pay

## 2019-08-06 ENCOUNTER — Encounter: Payer: Self-pay | Admitting: Emergency Medicine

## 2019-08-06 ENCOUNTER — Emergency Department
Admission: EM | Admit: 2019-08-06 | Discharge: 2019-08-06 | Disposition: A | Discharge status: Home / Self Care | Payer: Managed Care, Other (non HMO) | Attending: Emergency Medicine | Admitting: Emergency Medicine

## 2019-08-06 DIAGNOSIS — J45909 Unspecified asthma, uncomplicated: Secondary | ICD-10-CM | POA: Insufficient documentation

## 2019-08-06 DIAGNOSIS — R109 Unspecified abdominal pain: Secondary | ICD-10-CM | POA: Diagnosis present

## 2019-08-06 DIAGNOSIS — Z79899 Other long term (current) drug therapy: Secondary | ICD-10-CM | POA: Insufficient documentation

## 2019-08-06 DIAGNOSIS — F17210 Nicotine dependence, cigarettes, uncomplicated: Secondary | ICD-10-CM | POA: Diagnosis not present

## 2019-08-06 DIAGNOSIS — N838 Other noninflammatory disorders of ovary, fallopian tube and broad ligament: Secondary | ICD-10-CM | POA: Diagnosis not present

## 2019-08-06 LAB — CBC WITH DIFFERENTIAL/PLATELET
Abs Immature Granulocytes: 0.01 10*3/uL (ref 0.00–0.07)
Basophils Absolute: 0.1 10*3/uL (ref 0.0–0.1)
Basophils Relative: 1 %
Eosinophils Absolute: 0.1 10*3/uL (ref 0.0–0.5)
Eosinophils Relative: 3 %
HCT: 34.9 % — ABNORMAL LOW (ref 36.0–46.0)
Hemoglobin: 11 g/dL — ABNORMAL LOW (ref 12.0–15.0)
Immature Granulocytes: 0 %
Lymphocytes Relative: 39 %
Lymphs Abs: 1.8 10*3/uL (ref 0.7–4.0)
MCH: 26.4 pg (ref 26.0–34.0)
MCHC: 31.5 g/dL (ref 30.0–36.0)
MCV: 83.7 fL (ref 80.0–100.0)
Monocytes Absolute: 0.4 10*3/uL (ref 0.1–1.0)
Monocytes Relative: 9 %
Neutro Abs: 2.1 10*3/uL (ref 1.7–7.7)
Neutrophils Relative %: 48 %
Platelets: 161 10*3/uL (ref 150–400)
RBC: 4.17 MIL/uL (ref 3.87–5.11)
RDW: 14.4 % (ref 11.5–15.5)
WBC: 4.5 10*3/uL (ref 4.0–10.5)
nRBC: 0 % (ref 0.0–0.2)

## 2019-08-06 LAB — POCT PREGNANCY, URINE: Preg Test, Ur: NEGATIVE

## 2019-08-06 LAB — COMPREHENSIVE METABOLIC PANEL
ALT: 45 U/L — ABNORMAL HIGH (ref 0–44)
AST: 49 U/L — ABNORMAL HIGH (ref 15–41)
Albumin: 3.8 g/dL (ref 3.5–5.0)
Alkaline Phosphatase: 58 U/L (ref 38–126)
Anion gap: 8 (ref 5–15)
BUN: 11 mg/dL (ref 6–20)
CO2: 20 mmol/L — ABNORMAL LOW (ref 22–32)
Calcium: 9.1 mg/dL (ref 8.9–10.3)
Chloride: 110 mmol/L (ref 98–111)
Creatinine, Ser: 0.78 mg/dL (ref 0.44–1.00)
GFR calc Af Amer: >60 mL/min (ref >60)
GFR calc non Af Amer: >60 mL/min (ref >60)
Glucose, Bld: 166 mg/dL — ABNORMAL HIGH (ref 70–99)
Potassium: 4 mmol/L (ref 3.5–5.1)
Sodium: 138 mmol/L (ref 135–145)
Total Bilirubin: 0.5 mg/dL (ref 0.3–1.2)
Total Protein: 7.1 g/dL (ref 6.5–8.1)

## 2019-08-06 LAB — URINALYSIS, COMPLETE (UACMP) WITH MICROSCOPIC
Bilirubin Urine: NEGATIVE
Glucose, UA: NEGATIVE mg/dL
Ketones, ur: NEGATIVE mg/dL
Nitrite: NEGATIVE
Protein, ur: 30 mg/dL — AB
Specific Gravity, Urine: 1.019 (ref 1.005–1.030)
Squamous Epithelial / LPF: >50 — ABNORMAL HIGH (ref 0–5)
pH: 5 (ref 5.0–8.0)

## 2019-08-06 LAB — LIPASE, BLOOD: Lipase: 42 U/L (ref 11–51)

## 2019-08-06 MED ORDER — HYDROCODONE-ACETAMINOPHEN 5-325 MG PO TABS: 1.0000 | ORAL_TABLET | Freq: Four times a day (QID) | ORAL | 0 refills | Status: DC | PRN

## 2019-08-06 MED ORDER — CIPROFLOXACIN HCL 500 MG PO TABS: 500.0000 mg | ORAL_TABLET | Freq: Two times a day (BID) | ORAL | 0 refills | Status: DC

## 2019-08-06 MED ORDER — OXYCODONE-ACETAMINOPHEN 5-325 MG PO TABS
1.0000 | ORAL_TABLET | Freq: Once | ORAL | Status: AC
  Administered 2019-08-06: 1 via ORAL
  Filled 2019-08-06: qty 1

## 2019-08-06 MED ORDER — KETOROLAC TROMETHAMINE 30 MG/ML IJ SOLN
30.0000 mg | Freq: Once | INTRAMUSCULAR | Status: AC
  Administered 2019-08-06: 30 mg via INTRAMUSCULAR
  Filled 2019-08-06: qty 1

## 2019-08-06 NOTE — ED Notes (Signed)
Pt. POC resulted neg.

## 2019-08-06 NOTE — ED Triage Notes (Signed)
Patient ambulatory to triage with steady gait, without difficulty or distress noted, mask in place; pt reports since last night having pain to rt side radiating into rt lower abd with no accomp symptoms; st "I need a work note so I can go back to work"

## 2019-08-06 NOTE — ED Provider Notes (Signed)
[facility] Emergency Department Provider Note   ____________________________________________    I have reviewed the triage vital signs and the nursing notes.   HISTORY  Chief Complaint Abdominal Pain     HPI Abigail Cunningham is a 39 y.o. female with history as noted below who presents with complaints of right flank pain.  Patient reports symptoms been ongoing for several days now worsened last night.  She describes cramping, "charley horse "type pain.  Took an ibuprofen last night with some improvement.  Review of medical record demonstrates a history of pyelonephritis in the past that required admission.  Denies fevers or chills.  No nausea or vomiting.  Reports that she may be starting her menstrual cycle as well.  Review of medical records demonstrates history of ovarian cyst which was followed up with GYN which apparently resolved  Past Medical History:  Diagnosis Date  . Asthma   . Borderline personality disorder ([place])   . Bronchitis unk  . Chronic hepatitis ([place])   . Generalized OA   . 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 Kidney stone   . Migraine     Patient Active Problem List   Diagnosis Date Noted  . Acute pyelonephritis 02/03/2018    Past Surgical History:  Procedure Laterality Date  . ABDOMINAL HYSTERECTOMY    . LITHOTRIPSY    . OVARIAN CYST REMOVAL      Prior to Admission medications   Medication Sig Start Date End Date Taking? Authorizing Provider  ciprofloxacin (CIPRO) 500 MG tablet Take 1 tablet (500 mg total) by mouth 2 (two) times daily. 08/06/19   Lavonia Drafts, MD  fluticasone (FLONASE) 50 MCG/ACT nasal spray Place 2 sprays into both nostrils daily. 02/07/17 02/07/18  Laban Emperor, PA-C  HYDROcodone-acetaminophen (NORCO/VICODIN) 5-325 MG tablet Take 1 tablet by mouth every 6 (six) hours as needed for severe pain. 08/06/19   Lavonia Drafts, MD  ibuprofen (ADVIL,MOTRIN) 600 MG  tablet Take 1 tablet (600 mg total) by mouth every 8 (eight) hours as needed. 07/04/17   Sable Feil, PA-C  lamoTRIgine (LAMICTAL) 25 MG tablet Take 50 mg by mouth 2 (two) times daily. Take 25 mg daily week 1; 25 mg twice daily week 2; 50 mg in the morning and 25 mg at night week 3; 50 mg twice daily week 4. 08/18/16   [provider]  SUMAtriptan (IMITREX) 25 MG tablet Take 1 tablet (25 mg total) by mouth every 8 (eight) hours as needed for migraine or headache. May repeat in 2 hours if headache persists or recurs. 02/05/18   Saundra Shelling, MD     Allergies Haldol [haloperidol lactate]  Family History  Adopted: Yes    Social History Social History   Tobacco Use  . Smoking status: Current Some Day Smoker  . Smokeless tobacco: Never Used  Substance Use Topics  . Alcohol use: No  . Drug use: Never    Review of Systems  Constitutional: No fever/chills Eyes: No visual changes.  ENT: No sore throat. Cardiovascular: Denies chest pain. Respiratory: Denies shortness of breath. Gastrointestinal: As above Genitourinary:  as above Musculoskeletal: Negative for back pain. Skin: Negative for rash. Neurological: Negative for headaches or weakness   ____________________________________________   PHYSICAL EXAM:  VITAL SIGNS: ED Triage Vitals  Enc Vitals Group     BP 08/06/19 0642 (!) 112/50     Pulse Rate 08/06/19 0642 62     Resp 08/06/19  [id number]     Temp 08/06/19 0642 98.1 F (36.7 C)     Temp Source 08/06/19 0642 Oral     SpO2 08/06/19 0642 97 %     Weight 08/06/19 0641 88.5 kg (195 lb)     Height 08/06/19 0641 1.6 m (5\' 3" )     Head Circumference --      Peak Flow --      Pain Score 08/06/19 0641 5     Pain Loc --      Pain Edu? --      Excl. in [place]? --     Constitutional: Alert and oriented.   Nose: No congestion/rhinnorhea. Mouth/Throat: Mucous membranes are moist.    Cardiovascular: Normal rate, regular rhythm. Grossly normal heart sounds.  Good  peripheral circulation. Respiratory: Normal respiratory effort.  No retractions. Lungs CTAB. Gastrointestinal: Soft and nontender. No distention.  No CVA tenderness, mild paraspinal lumbar muscular discomfort  Musculoskeletal:   Warm and well perfused Neurologic:  Normal speech and language. No gross focal neurologic deficits are appreciated.  Skin:  Skin is warm, dry and intact. No rash noted. Psychiatric: Mood and affect are normal. Speech and behavior are normal.  ____________________________________________   LABS (all labs ordered are listed, but only abnormal results are displayed)  Labs Reviewed  CBC WITH DIFFERENTIAL/PLATELET - Abnormal; Notable for the following components:      Result Value   Hemoglobin 11.0 (*)    HCT 34.9 (*)    All other components within normal limits  COMPREHENSIVE METABOLIC PANEL - Abnormal; Notable for the following components:   CO2 20 (*)    Glucose, Bld 166 (*)    AST 49 (*)    ALT 45 (*)    All other components within normal limits  URINALYSIS, COMPLETE (UACMP) WITH MICROSCOPIC - Abnormal; Notable for the following components:   Color, Urine YELLOW (*)    APPearance CLOUDY (*)    Hgb urine dipstick SMALL (*)    Protein, ur 30 (*)    Leukocytes,Ua LARGE (*)    Bacteria, UA RARE (*)    Squamous Epithelial / LPF >50 (*)    All other components within normal limits  LIPASE, BLOOD  POCT PREGNANCY, URINE   ____________________________________________  EKG  None ____________________________________________  RADIOLOGY  CT renal stone study ____________________________________________   PROCEDURES  Procedure(s) performed: No  Procedures   Critical Care performed: No ____________________________________________   INITIAL IMPRESSION / ASSESSMENT AND PLAN / ED COURSE  Pertinent labs & imaging results that were available during my care of the patient were reviewed by me and considered in my medical decision making (see chart for  details).  Patient presents with right flank pain, history of pyelonephritis, urine appears contaminated.  Lab work is overall reassuring does have a history of chronic autoimmune hepatitis.  Will treat with IM Toradol.  No fevers or chills, no CVA tenderness.  Differential includes urinary tract infection, pyelonephritis, ureterolithiasis.  Pending CT imaging.   CT scan shows enlarging left cystic ovarian mass which is highly concerning.  I discussed this with her at length.  I spoke with Dr. Glennon Mac of GYN to ensure close follow-up, he will see the patient in his office as soon as possible.      ____________________________________________   FINAL CLINICAL IMPRESSION(S) / ED DIAGNOSES  Final diagnoses:  Flank pain  Ovarian mass, left        Note:  This document was prepared using Dragon voice recognition software and  may include unintentional dictation errors.   Lavonia Drafts, MD 08/06/19 [phone]

## 2019-08-06 NOTE — Discharge Instructions (Signed)
Your CT scan demonstrates that the ovarian cyst/mass on your left ovary has enlarged significantly. This is concerning and needs to be evaluated by gynecology as soon as possible

## 2019-08-06 NOTE — ED Notes (Signed)
Family at bedside  States she is only having pressure at present denies pain

## 2019-08-06 NOTE — ED Notes (Signed)
See triage note Presents with pain to right side abd/rub area  States pain started yesterday  States she has a hx of UTI's and kidney stones  No n/v or fever

## 2019-08-07 ENCOUNTER — Other Ambulatory Visit (HOSPITAL_COMMUNITY)
Admission: RE | Admit: 2019-08-07 | Discharge: 2019-08-07 | Disposition: A | Discharge status: Home / Self Care | Payer: Managed Care, Other (non HMO) | Source: Ambulatory Visit | Attending: Obstetrics and Gynecology | Admitting: Obstetrics and Gynecology

## 2019-08-07 ENCOUNTER — Ambulatory Visit (INDEPENDENT_AMBULATORY_CARE_PROVIDER_SITE_OTHER): Payer: Managed Care, Other (non HMO) | Admitting: Obstetrics and Gynecology

## 2019-08-07 ENCOUNTER — Encounter: Payer: Self-pay | Admitting: Obstetrics and Gynecology

## 2019-08-07 VITALS — BP 120/70 | Ht 63.0 in | Wt 205.0 lb

## 2019-08-07 DIAGNOSIS — R1030 Lower abdominal pain, unspecified: Secondary | ICD-10-CM | POA: Diagnosis not present

## 2019-08-07 DIAGNOSIS — Z124 Encounter for screening for malignant neoplasm of cervix: Secondary | ICD-10-CM

## 2019-08-07 DIAGNOSIS — K754 Autoimmune hepatitis: Secondary | ICD-10-CM

## 2019-08-07 DIAGNOSIS — Z1231 Encounter for screening mammogram for malignant neoplasm of breast: Secondary | ICD-10-CM

## 2019-08-07 DIAGNOSIS — N83202 Unspecified ovarian cyst, left side: Secondary | ICD-10-CM

## 2019-08-07 DIAGNOSIS — K7469 Other cirrhosis of liver: Secondary | ICD-10-CM

## 2019-08-07 NOTE — Progress Notes (Signed)
Patient ID: Abigail Cunningham, female   DOB: April 20, 1981, 39 y.o.   MRN: [id number]  Reason for Consult: Follow-up (Severe pain on the sides of back x1 week)   Referred by Maryland Pink, MD  Subjective:     HPI:  Abigail Cunningham is a 39 y.o. female She presents today with complaints of muscle spasm and pain in her lower abdomen for 1 week. She has had a recent weight gain of 25 lbs in 4 months. She was seen recently in the ER for this pain and CT showed an enlarged left adnexal cyst measuring 21.3x12.9x19.0 cm. Right ovary and uterus were grossly normal.   She does note that she has a history of autoimmune hepatitis and cirrhosis. She has not a seen a GI physician in some time.   Gynecological History Menarche: 11 Menopause: not applicabl LMP: [id number] Describes periods as regular, montly lasting for 5 days with heavy bleeding. She passes quarter size clots. She has gushing sensations of blood. She will have frequent accidents and has to take extra clothes to work. She saturates 10 pads a day and changes her pap every hour. She uses a towel at night because of the heavy bleeding. She reports that her periods have always been this heavy and they have not changed.  Last pap smear: uncertain Last Mammogram: never History of STDs: Denies Sexually Active: yes, no pain.   History of a ruptured ovarian cyst when she was 22. She had surgery for the cyst at [facility].  No history of OCP usage. Used DepoProvera between the ages of [age] and [age] She breastfeed for cumulatively 12 months.  Obstetrical History [id number] 01/28/1999- Vaginal delivery of female infant- gestational diabetes 10/19/2000- Vaginal delivery of female infant- gestational diabetes 2010 Spontaneous abortion, surgery not required 2016- Ectopic pregnancy treated with MTX   Past Medical History:  Diagnosis Date  . Asthma   . Borderline personality disorder ([place])   . Bronchitis unk  . Chronic hepatitis ([place])   .  Generalized OA   . Immune deficiency disorder ([place])    chronic autoimmune hepatitis per pt  . Kidney stone   . Migraine    Family History  Adopted: Yes   Past Surgical History:  Procedure Laterality Date  . ABDOMINAL HYSTERECTOMY    . LITHOTRIPSY    . OVARIAN CYST REMOVAL      Short Social History:  Social History   Tobacco Use  . Smoking status: Current Some Day Smoker  . Smokeless tobacco: Never Used  Substance Use Topics  . Alcohol use: No    Allergies  Allergen Reactions  . Haldol [Haloperidol Lactate] Other (See Comments)    Shuts down 'system'    Current Outpatient Medications  Medication Sig Dispense Refill  . ciprofloxacin (CIPRO) 500 MG tablet Take 1 tablet (500 mg total) by mouth 2 (two) times daily. 14 tablet 0  . HYDROcodone-acetaminophen (NORCO/VICODIN) 5-325 MG tablet Take 1 tablet by mouth every 6 (six) hours as needed for severe pain. 8 tablet 0   No current facility-administered medications for this visit.    Review of Systems  Constitutional: Negative for chills, fatigue, fever and unexpected weight change.  HENT: Negative for trouble swallowing.  Eyes: Negative for loss of vision.  Respiratory: Negative for cough, shortness of breath and wheezing.  Cardiovascular: Negative for chest pain, leg swelling, palpitations and syncope.  GI: Negative for abdominal pain, blood in stool, diarrhea, nausea and vomiting.  GU: Negative for difficulty urinating, dysuria, frequency  and hematuria.  Musculoskeletal: Negative for back pain, leg pain and joint pain.  Skin: Negative for rash.  Neurological: Negative for dizziness, headaches, light-headedness, numbness and seizures.  Psychiatric: Negative for behavioral problem, confusion, depressed mood and sleep disturbance.       Objective:  Objective   Vitals:   08/07/19 1425  BP: 120/70  Weight: 205 lb (93 kg)  Height: 5\' 3"  (1.6 m)   Body mass index is 36.31 kg/m.  Physical Exam Vitals and nursing  note reviewed.  Constitutional:      Appearance: She is well-developed.  HENT:     Head: Normocephalic and atraumatic.  Eyes:     Pupils: Pupils are equal, round, and reactive to light.  Cardiovascular:     Rate and Rhythm: Normal rate and regular rhythm.  Pulmonary:     Effort: Pulmonary effort is normal. No respiratory distress.  Genitourinary:    Comments: External: Vulva normal. No lesions noted.  Speculum examination:  Cervix normal . No blood in the vaginal vault. No discharge.   Bimanual examination: Uterus midline, non-tender, normal in size, shape and contour. No CMT. Adnexa Mass enlarged above the belly button. Pelvis is not fixed. Skin:    General: Skin is warm and dry.  Neurological:     Mental Status: She is alert and oriented to person, place, and time.  Psychiatric:        Behavior: Behavior normal.        Thought Content: Thought content normal.        Judgment: Judgment normal.      Assessment/Plan:    39 yo with Ovarian Mass Discussed options for surgical removal with the patient. Would ideally like to remove the mass laparoscopically. Will obtain CA125. Will refer to gynecological oncology for assistance in complicated case. Will plan for surgery soon given large size of mass and discomfort of patient.  Referral to GI to establish care for cirrhosis.  Discussed Mammogram as part of routine screening. Ordered for patient.   More than 45 minutes were spent face to face with the patient in the room with more than 50% of the time spent providing counseling and discussing the plan of management.    Adrian Prows MD [facility] 08/07/2019 2:34 PM

## 2019-08-07 NOTE — H&P (View-Only) (Signed)
Patient ID: Abigail Cunningham, female   DOB: Jan 29, 1981, 39 y.o.   MRN: [id number]  Reason for Consult: Follow-up (Severe pain on the sides of back x1 week)   Referred by Maryland Pink, MD  Subjective:     HPI:  Abigail Cunningham is a 39 y.o. female She presents today with complaints of muscle spasm and pain in her lower abdomen for 1 week. She has had a recent weight gain of 25 lbs in 4 months. She was seen recently in the ER for this pain and CT showed an enlarged left adnexal cyst measuring 21.3x12.9x19.0 cm. Right ovary and uterus were grossly normal.   She does note that she has a history of autoimmune hepatitis and cirrhosis. She has not a seen a GI physician in some time.   Gynecological History Menarche: 11 Menopause: not applicabl LMP: [id number] Describes periods as regular, montly lasting for 5 days with heavy bleeding. She passes quarter size clots. She has gushing sensations of blood. She will have frequent accidents and has to take extra clothes to work. She saturates 10 pads a day and changes her pap every hour. She uses a towel at night because of the heavy bleeding. She reports that her periods have always been this heavy and they have not changed.  Last pap smear: uncertain Last Mammogram: never History of STDs: Denies Sexually Active: yes, no pain.   History of a ruptured ovarian cyst when she was 22. She had surgery for the cyst at [facility].  No history of OCP usage. Used DepoProvera between the ages of [age] and [age] She breastfeed for cumulatively 12 months.  Obstetrical History [id number] 01/28/1999- Vaginal delivery of female infant- gestational diabetes 10/19/2000- Vaginal delivery of female infant- gestational diabetes 2010 Spontaneous abortion, surgery not required 2016- Ectopic pregnancy treated with MTX   Past Medical History:  Diagnosis Date  . Asthma   . Borderline personality disorder ([place])   . Bronchitis unk  . Chronic hepatitis ([place])   .  Generalized OA   . Immune deficiency disorder ([place])    chronic autoimmune hepatitis per pt  . Kidney stone   . Migraine    Family History  Adopted: Yes   Past Surgical History:  Procedure Laterality Date  . ABDOMINAL HYSTERECTOMY    . LITHOTRIPSY    . OVARIAN CYST REMOVAL      Short Social History:  Social History   Tobacco Use  . Smoking status: Current Some Day Smoker  . Smokeless tobacco: Never Used  Substance Use Topics  . Alcohol use: No    Allergies  Allergen Reactions  . Haldol [Haloperidol Lactate] Other (See Comments)    Shuts down 'system'    Current Outpatient Medications  Medication Sig Dispense Refill  . ciprofloxacin (CIPRO) 500 MG tablet Take 1 tablet (500 mg total) by mouth 2 (two) times daily. 14 tablet 0  . HYDROcodone-acetaminophen (NORCO/VICODIN) 5-325 MG tablet Take 1 tablet by mouth every 6 (six) hours as needed for severe pain. 8 tablet 0   No current facility-administered medications for this visit.    Review of Systems  Constitutional: Negative for chills, fatigue, fever and unexpected weight change.  HENT: Negative for trouble swallowing.  Eyes: Negative for loss of vision.  Respiratory: Negative for cough, shortness of breath and wheezing.  Cardiovascular: Negative for chest pain, leg swelling, palpitations and syncope.  GI: Negative for abdominal pain, blood in stool, diarrhea, nausea and vomiting.  GU: Negative for difficulty urinating, dysuria, frequency  and hematuria.  Musculoskeletal: Negative for back pain, leg pain and joint pain.  Skin: Negative for rash.  Neurological: Negative for dizziness, headaches, light-headedness, numbness and seizures.  Psychiatric: Negative for behavioral problem, confusion, depressed mood and sleep disturbance.       Objective:  Objective   Vitals:   08/07/19 1425  BP: 120/70  Weight: 205 lb (93 kg)  Height: 5\' 3"  (1.6 m)   Body mass index is 36.31 kg/m.  Physical Exam Vitals and nursing  note reviewed.  Constitutional:      Appearance: She is well-developed.  HENT:     Head: Normocephalic and atraumatic.  Eyes:     Pupils: Pupils are equal, round, and reactive to light.  Cardiovascular:     Rate and Rhythm: Normal rate and regular rhythm.  Pulmonary:     Effort: Pulmonary effort is normal. No respiratory distress.  Genitourinary:    Comments: External: Vulva normal. No lesions noted.  Speculum examination:  Cervix normal . No blood in the vaginal vault. No discharge.   Bimanual examination: Uterus midline, non-tender, normal in size, shape and contour. No CMT. Adnexa Mass enlarged above the belly button. Pelvis is not fixed. Skin:    General: Skin is warm and dry.  Neurological:     Mental Status: She is alert and oriented to person, place, and time.  Psychiatric:        Behavior: Behavior normal.        Thought Content: Thought content normal.        Judgment: Judgment normal.      Assessment/Plan:    39 yo with Ovarian Mass Discussed options for surgical removal with the patient. Would ideally like to remove the mass laparoscopically. Will obtain CA125. Will refer to gynecological oncology for assistance in complicated case. Will plan for surgery soon given large size of mass and discomfort of patient.  Referral to GI to establish care for cirrhosis.  Discussed Mammogram as part of routine screening. Ordered for patient.   More than 45 minutes were spent face to face with the patient in the room with more than 50% of the time spent providing counseling and discussing the plan of management.    Adrian Prows MD [facility] 08/07/2019 2:34 PM

## 2019-08-08 LAB — OVARIAN MALIGNANCY RISK-ROMA
Cancer Antigen (CA) 125: 16.4 U/mL (ref 0.0–38.1)
HE4: 58.2 pmol/L (ref 0.0–63.6)
Postmenopausal ROMA: 1.4
Premenopausal ROMA: 1.04

## 2019-08-08 LAB — PREMENOPAUSAL INTERP: LOW

## 2019-08-08 LAB — POSTMENOPAUSAL INTERP: LOW

## 2019-08-09 DIAGNOSIS — N83202 Unspecified ovarian cyst, left side: Secondary | ICD-10-CM | POA: Insufficient documentation

## 2019-08-09 LAB — CYTOLOGY - PAP
Comment: NEGATIVE
Diagnosis: NEGATIVE
High risk HPV: NEGATIVE

## 2019-08-12 ENCOUNTER — Other Ambulatory Visit: Payer: Self-pay

## 2019-08-12 ENCOUNTER — Ambulatory Visit
Admission: RE | Admit: 2019-08-12 | Discharge: 2019-08-12 | Disposition: A | Discharge status: Home / Self Care | Payer: Managed Care, Other (non HMO) | Source: Ambulatory Visit | Attending: Family Medicine | Admitting: Family Medicine

## 2019-08-12 ENCOUNTER — Other Ambulatory Visit: Payer: Self-pay | Admitting: Obstetrics and Gynecology

## 2019-08-12 ENCOUNTER — Telehealth: Payer: Self-pay | Admitting: Obstetrics and Gynecology

## 2019-08-12 ENCOUNTER — Other Ambulatory Visit
Admission: RE | Admit: 2019-08-12 | Discharge: 2019-08-12 | Disposition: A | Discharge status: Home / Self Care | Payer: Managed Care, Other (non HMO) | Source: Ambulatory Visit | Attending: Obstetrics and Gynecology | Admitting: Obstetrics and Gynecology

## 2019-08-12 DIAGNOSIS — Z01818 Encounter for other preprocedural examination: Secondary | ICD-10-CM | POA: Diagnosis not present

## 2019-08-12 DIAGNOSIS — Z20822 Contact with and (suspected) exposure to covid-19: Secondary | ICD-10-CM | POA: Insufficient documentation

## 2019-08-12 DIAGNOSIS — N83202 Unspecified ovarian cyst, left side: Secondary | ICD-10-CM

## 2019-08-12 LAB — SARS CORONAVIRUS 2 (TAT 6-24 HRS): SARS Coronavirus 2: NEGATIVE

## 2019-08-12 MED ORDER — HYDROCODONE-ACETAMINOPHEN 5-325 MG PO TABS: 1.0000 | ORAL_TABLET | Freq: Four times a day (QID) | ORAL | 0 refills | Status: DC | PRN

## 2019-08-12 NOTE — Telephone Encounter (Signed)
Caryl Pina in pre-admit has called the pt and she is coming in for an EKG today however CLEARANCE will be needed per Dr Andree Elk in Anesthesia.  Dr. Andree Elk states that if the PCP would not give clearance knowing the patient, we will need to wait until she is seen and cleared, if it is not an emergent situation

## 2019-08-12 NOTE — Telephone Encounter (Signed)
Her PCP can't see her until 4/19. Would like to know if she can see a different provider sooner.

## 2019-08-12 NOTE — Telephone Encounter (Signed)
Called pt to adv that we are sch surgery for 3/30 or 3/31. Need her to go now for Covid testing and quar until DOS.   Adv that I will be calling her with more info regarding DOS and any other instructions.  Faxing Med Clearance to pt PCP Maryland Pink, MD per Dr Gilman Schmidt.

## 2019-08-12 NOTE — Telephone Encounter (Signed)
I called PCP and explained need for ASAP appt. They are to be getting back to me. I was contacted by OR and I went ahead and had her case pulled back down to the depot. I have placed a call to pt but had to leave msg.

## 2019-08-12 NOTE — Telephone Encounter (Signed)
Pt called in asking about tomorrow's sch surgery. I adv that I had to take the surgery off the schedule for tomorrow due to not being able to secure medical clearance from her PCP.   I adv that I have been in touch with her PCP office and am trying to work something out to have her seen sooner than the 4/19 date that she was given. I adv she is on a cancellization list there as well.  I adv that I will keep her informed on what information I get and that she can call and ask for me with any questions pertaining to her upcoming surgery.

## 2019-08-12 NOTE — Telephone Encounter (Signed)
-----   Message from Homero Fellers, MD sent at 08/09/2019  5:15 PM EDT ----- Surgery Booking Request Patient Full Name:  Abigail Cunningham  MRN: [id number]  DOB: [birth year]  Surgeon: Homero Fellers, MD  Requested Surgery Date and Time: ASAP- 3/30 or 3/31 if possible Primary Diagnosis AND Code: Left ovarian cyst N83.202 Secondary Diagnosis and Code:  Surgical Procedure: Laparoscopic left salpingo-oophorectomy  L&D Notification: No Admission Status: same day surgery Length of Surgery: 2 hours Special Case Needs: No H&P: No Phone Interview???:  Yes Interpreter: No Language:  Medical Clearance:  Yes Special Scheduling Instructions: none Any known health/anesthesia issues, diabetes, sleep apnea, latex allergy, defibrillator/pacemaker?: Yes- autoimmune hepatitis, cirrhosis  Acuity: P2   (P1 highest, P2 delay may cause harm, P3 low, elective gyn, P4 lowest)

## 2019-08-12 NOTE — Telephone Encounter (Signed)
-----   Message from Homero Fellers, MD sent at 08/09/2019  5:15 PM EDT ----- Surgery Booking Request Patient Full Name:  Abigail Cunningham  MRN: [id number]  DOB: [birth year]  Surgeon: Homero Fellers, MD  Requested Surgery Date and Time: ASAP- 3/30 or 3/31 if possible Primary Diagnosis AND Code: Left ovarian cyst N83.202 Secondary Diagnosis and Code:  Surgical Procedure: Laparoscopic left salpingo-oophorectomy  L&D Notification: No Admission Status: same day surgery Length of Surgery: 2 hours Special Case Needs: No H&P: No Phone Interview???:  Yes Interpreter: No Language:  Medical Clearance:  Yes Special Scheduling Instructions: none Any known health/anesthesia issues, diabetes, sleep apnea, latex allergy, defibrillator/pacemaker?: Yes- autoimmune hepatitis, cirrhosis  Acuity: P2   (P1 highest, P2 delay may cause harm, P3 low, elective gyn, P4 lowest)

## 2019-08-12 NOTE — Telephone Encounter (Signed)
-----   Message from Homero Fellers, MD sent at 08/09/2019  5:15 PM EDT ----- Surgery Booking Request Patient Full Name:  Abigail Cunningham  MRN: [id number]  DOB: 1980/11/26  Surgeon: Homero Fellers, MD  Requested Surgery Date and Time: ASAP- 3/30 or 3/31 if possible Primary Diagnosis AND Code: Left ovarian cyst N83.202 Secondary Diagnosis and Code:  Surgical Procedure: Laparoscopic left salpingo-oophorectomy  L&D Notification: No Admission Status: same day surgery Length of Surgery: 2 hours Special Case Needs: No H&P: No Phone Interview???:  Yes Interpreter: No Language:  Medical Clearance:  Yes Special Scheduling Instructions: none Any known health/anesthesia issues, diabetes, sleep apnea, latex allergy, defibrillator/pacemaker?: Yes- autoimmune hepatitis, cirrhosis  Acuity: P2   (P1 highest, P2 delay may cause harm, P3 low, elective gyn, P4 lowest)

## 2019-08-12 NOTE — Progress Notes (Signed)
WNL-called patient, no answer,  patient in process of being scheduled for unilateral salpingo-oophorectomy

## 2019-08-13 NOTE — Telephone Encounter (Signed)
-----   Message from Homero Fellers, MD sent at 08/09/2019  5:15 PM EDT ----- Surgery Booking Request Patient Full Name:  Abigail Cunningham  MRN: [id number]  DOB: 02-23-1981  Surgeon: Homero Fellers, MD  Requested Surgery Date and Time: ASAP- 3/30 or 3/31 if possible Primary Diagnosis AND Code: Left ovarian cyst N83.202 Secondary Diagnosis and Code:  Surgical Procedure: Laparoscopic left salpingo-oophorectomy  L&D Notification: No Admission Status: same day surgery Length of Surgery: 2 hours Special Case Needs: No H&P: No Phone Interview???:  Yes Interpreter: No Language:  Medical Clearance:  Yes Special Scheduling Instructions: none Any known health/anesthesia issues, diabetes, sleep apnea, latex allergy, defibrillator/pacemaker?: Yes- autoimmune hepatitis, cirrhosis  Acuity: P2   (P1 highest, P2 delay may cause harm, P3 low, elective gyn, P4 lowest)

## 2019-08-13 NOTE — Telephone Encounter (Signed)
Recd medical clearance fax and call from PCP office. Pt also called to adv that she had been seen by the PCP for clearance. She wants to schedule surgery ASAP. I told her I would follow up with Dr Gilman Schmidt and call her back to adv

## 2019-08-14 ENCOUNTER — Ambulatory Visit (INDEPENDENT_AMBULATORY_CARE_PROVIDER_SITE_OTHER): Payer: Managed Care, Other (non HMO) | Admitting: Gastroenterology

## 2019-08-14 ENCOUNTER — Encounter: Payer: Self-pay | Admitting: Gastroenterology

## 2019-08-14 DIAGNOSIS — K754 Autoimmune hepatitis: Secondary | ICD-10-CM

## 2019-08-14 NOTE — Telephone Encounter (Signed)
Pt returned called and I adv of her surgery scheduled for tomorrow with Dr Gilman Schmidt. She asked if she is to still arrive at [facility]. I adv that she needs to call [phone] between 1-3pm today to get her time to report in for surgery. Adv that she still needs to quarantine until then.  Conf with her to NPO past 12:00am

## 2019-08-14 NOTE — Telephone Encounter (Signed)
L/M for pt to rtn call regarding surgery scheduled STAT for tomorrow 4/1.

## 2019-08-14 NOTE — Progress Notes (Signed)
Abigail Cunningham [address]  [place]  [place], [place] [address]  Main: [phone]  Fax: [phone]   Gastroenterology Consultation  Referring Provider:     Homero Fellers, * Primary Care Physician:  Maryland Pink, MD Reason for Consultation:     Cirrhosis, autoimmune hepatitis        HPI:   Virtual Visit via Video Note  I connected with patient on 08/14/19 at  1:15 PM EDT by video ([email or web address]) and verified that I am speaking with the correct person using two identifiers.   I discussed the limitations, risks, security and privacy concerns of performing an evaluation and management service by video and the availability of in person appointments. I also discussed with the patient that there may be a patient responsible charge related to this service. The patient expressed understanding and agreed to proceed.  Location of the patient: Home Location of provider: Home Participating persons: Patient and provider only (Nursing staff checked in patient via phone but were not physically involved in the video interaction - see their notes)   History of Present Illness: Chief Complaint  Patient presents with  . autoimmune hepatitis    Abigail Cunningham is a 39 y.o. y/o female referred for consultation & management  by Dr. Maryland Pink, MD.  Patient reports history of autoimmune hepatitis and has not seen a GI for this in 13 to 15 years.  States previously was following at [facility] for the same.  Describes being on steroids previously as well.  I do not have her previous records.  Patient states a liver biopsy was also done at that time.  Denies any upper endoscopies for screening.  Denies any previous episodes of bleeding or confusion.  Is currently undergoing work-up for an ovarian mass and is scheduled for surgery tomorrow.  Past Medical History:  Diagnosis Date  . Asthma   . Borderline personality disorder ([facility])   . Bronchitis unk  . Chronic hepatitis  ([facility])   . Generalized OA   . Immune deficiency disorder ([facility])    chronic autoimmune hepatitis per pt  . Kidney stone   . Migraine     Past Surgical History:  Procedure Laterality Date  . ABDOMINAL HYSTERECTOMY    . LITHOTRIPSY    . OVARIAN CYST REMOVAL      Prior to Admission medications   Medication Sig Start Date End Date Taking? Authorizing Provider  HYDROcodone-acetaminophen (NORCO/VICODIN) 5-325 MG tablet Take 1 tablet by mouth every 6 (six) hours as needed. 08/12/19  Yes Schuman, Stefanie Libel, MD    Family History  Adopted: Yes     Social History   Tobacco Use  . Smoking status: Current Some Day Smoker  . Smokeless tobacco: Never Used  Substance Use Topics  . Alcohol use: No  . Drug use: Never    Allergies as of 08/14/2019 - Review Complete 08/14/2019  Allergen Reaction Noted  . Haldol [haloperidol lactate] Other (See Comments) 10/23/2014    Review of Systems:    All systems reviewed and negative except where noted in HPI.   Observations/Objective:  Labs: CBC    Component Value Date/Time   WBC 4.5 08/06/2019 0646   RBC 4.17 08/06/2019 0646   HGB 11.0 (L) 08/06/2019 0646   HGB 15.1 01/07/2013 0112   HCT 34.9 (L) 08/06/2019 0646   HCT 42.6 01/07/2013 0112   PLT 161 08/06/2019 0646   PLT 139 (L) 01/07/2013 0112   MCV 83.7 08/06/2019 0646  MCV 93 01/07/2013 0112   MCH 26.4 08/06/2019 0646   MCHC 31.5 08/06/2019 0646   RDW 14.4 08/06/2019 0646   RDW 12.5 01/07/2013 0112   LYMPHSABS 1.8 08/06/2019 0646   MONOABS 0.4 08/06/2019 0646   EOSABS 0.1 08/06/2019 0646   BASOSABS 0.1 08/06/2019 0646   CMP     Component Value Date/Time   NA 138 08/06/2019 0646   NA 137 01/07/2013 0112   K 4.0 08/06/2019 0646   K 3.6 01/07/2013 0112   CL 110 08/06/2019 0646   CL 107 01/07/2013 0112   CO2 20 (L) 08/06/2019 0646   CO2 24 01/07/2013 0112   GLUCOSE 166 (H) 08/06/2019 0646   GLUCOSE 94 01/07/2013 0112   BUN 11 08/06/2019 0646   BUN 9 01/07/2013 0112    CREATININE 0.78 08/06/2019 0646   CREATININE 0.71 01/07/2013 0112   CALCIUM 9.1 08/06/2019 0646   CALCIUM 8.9 01/07/2013 0112   PROT 7.1 08/06/2019 0646   PROT 7.9 08/19/2012 1457   ALBUMIN 3.8 08/06/2019 0646   ALBUMIN 4.1 08/19/2012 1457   AST 49 (H) 08/06/2019 0646   AST 75 (H) 08/19/2012 1457   ALT 45 (H) 08/06/2019 0646   ALT 101 (H) 08/19/2012 1457   ALKPHOS 58 08/06/2019 0646   ALKPHOS 70 08/19/2012 1457   BILITOT 0.5 08/06/2019 0646   BILITOT 0.3 08/19/2012 1457   GFRNONAA >60 08/06/2019 0646   GFRNONAA >60 01/07/2013 0112   GFRAA >60 08/06/2019 0646   GFRAA >60 01/07/2013 0112    Imaging Studies: CT RENAL STONE STUDY  Result Date: 08/06/2019 CLINICAL DATA:  Right-sided flank pain since yesterday, history urinary tract infection EXAM: CT ABDOMEN AND PELVIS WITHOUT CONTRAST TECHNIQUE: Multidetector CT imaging of the abdomen and pelvis was performed following the standard protocol without IV contrast. COMPARISON:  02/03/2018 FINDINGS: Lower chest: Stable subpleural scarring within the right middle lobe. No acute pleural or parenchymal lung disease. Hepatobiliary: The liver displays a nodular contour consistent with cirrhosis. No focal abnormalities on this unenhanced exam. The gallbladder is unremarkable. Pancreas: Unremarkable. No pancreatic ductal dilatation or surrounding inflammatory changes. Spleen: Spleen is borderline enlarged, spleen is enlarged measuring 15 cm in anterior-posterior dimension. Adrenals/Urinary Tract: Persistent increased density of the renal medullary pyramids which may reflect nephrocalcinosis. 3 mm nonobstructing calculus right kidney reference image 29 of series 2, unchanged. No obstructive uropathy within either kidney. Ureters are unremarkable. The bladder is decompressed which limits evaluation. The adrenals are unremarkable. Stomach/Bowel: No bowel obstruction or ileus. Normal appendix right lower quadrant. No bowel wall thickening or inflammatory  changes. Vascular/Lymphatic: No significant vascular findings are present. No enlarged abdominal or pelvic lymph nodes. Reproductive: The left adnexal cyst seen on prior studies has increased in size, now measuring 12.9 by 19.0 cm in transverse dimension, and extending approximately 21.3 cm in craniocaudal length. Gynecologic consultation is recommended. If further imaging is required, MRI could be performed. There is a small follicle within the right ovary. The uterus is grossly unremarkable. Other: No abdominal wall hernia or abnormality. No abdominopelvic ascites. Musculoskeletal: No acute or destructive bony lesions. Reconstructed images demonstrate no additional findings. IMPRESSION: 1. Enlarging left adnexal cystic mass, now measuring 12.9 x 19.0 x 21.3 cm. Gynecologic consultation is recommended. If further imaging evaluation is desired, MRI could be performed. 2. Persistent increased density of the renal medullary pyramids may reflect nephrocalcinosis. 3. Stable 3 mm nonobstructing right renal calculus. 4. Cirrhosis with borderline splenomegaly. Electronically Signed   By: Diana Eves.D.  On: 08/06/2019 08:01    Assessment and Plan:   Abigail Cunningham is a 39 y.o. y/o female has been referred for autoimmune hepatitis  Assessment and Plan: Patient was referred to establish GI care and has not seen a GI physician in 13 to 15 years.  We will need to obtain all of her records from [facility] and [facility] to determine next steps.  Otherwise, transaminases done recently are mildly elevated.  No signs of decompensation at this time.  After obtaining previous records, for the labs, imaging and upper endoscopy can be ordered as necessary  Avoid hepatotoxic drugs  Continue close follow-up with OB/GYN for work-up and treatment and surgery of her ovarian mass, which would need to be addressed prior to any upper endoscopy procedures  Follow Up Instructions:   I discussed the assessment and treatment  plan with the patient. The patient was provided an opportunity to ask questions and all were answered. The patient agreed with the plan and demonstrated an understanding of the instructions.   The patient was advised to call back or seek an in-person evaluation if the symptoms worsen or if the condition fails to improve as anticipated.  I provided 15 minutes of face-to-face time via video software during this encounter.  Additional time was spent in reviewing patient's chart, placing orders etc.   Virgel Manifold, MD  Speech recognition software was used to dictate the above note.

## 2019-08-15 ENCOUNTER — Encounter
Admission: RE | Disposition: A | Discharge status: Home / Self Care | Payer: Self-pay | Source: Home / Self Care | Attending: Obstetrics and Gynecology

## 2019-08-15 ENCOUNTER — Ambulatory Visit
Admission: RE | Admit: 2019-08-15 | Discharge: 2019-08-15 | Disposition: A | Discharge status: Home / Self Care | Payer: Managed Care, Other (non HMO) | Attending: Obstetrics and Gynecology | Admitting: Obstetrics and Gynecology

## 2019-08-15 ENCOUNTER — Other Ambulatory Visit: Payer: Self-pay

## 2019-08-15 ENCOUNTER — Encounter: Payer: Self-pay | Admitting: Anesthesiology

## 2019-08-15 ENCOUNTER — Encounter: Payer: Self-pay | Admitting: Obstetrics and Gynecology

## 2019-08-15 DIAGNOSIS — J45909 Unspecified asthma, uncomplicated: Secondary | ICD-10-CM | POA: Insufficient documentation

## 2019-08-15 DIAGNOSIS — K660 Peritoneal adhesions (postprocedural) (postinfection): Secondary | ICD-10-CM | POA: Insufficient documentation

## 2019-08-15 DIAGNOSIS — F603 Borderline personality disorder: Secondary | ICD-10-CM | POA: Diagnosis not present

## 2019-08-15 DIAGNOSIS — F17200 Nicotine dependence, unspecified, uncomplicated: Secondary | ICD-10-CM | POA: Diagnosis not present

## 2019-08-15 DIAGNOSIS — N736 Female pelvic peritoneal adhesions (postinfective): Secondary | ICD-10-CM

## 2019-08-15 DIAGNOSIS — D271 Benign neoplasm of left ovary: Secondary | ICD-10-CM | POA: Diagnosis not present

## 2019-08-15 DIAGNOSIS — N9489 Other specified conditions associated with female genital organs and menstrual cycle: Secondary | ICD-10-CM | POA: Diagnosis not present

## 2019-08-15 DIAGNOSIS — K754 Autoimmune hepatitis: Secondary | ICD-10-CM | POA: Diagnosis not present

## 2019-08-15 DIAGNOSIS — N83202 Unspecified ovarian cyst, left side: Secondary | ICD-10-CM | POA: Diagnosis not present

## 2019-08-15 DIAGNOSIS — K7460 Unspecified cirrhosis of liver: Secondary | ICD-10-CM | POA: Insufficient documentation

## 2019-08-15 DIAGNOSIS — Z888 Allergy status to other drugs, medicaments and biological substances status: Secondary | ICD-10-CM | POA: Insufficient documentation

## 2019-08-15 HISTORY — PX: LAPAROSCOPIC UNILATERAL SALPINGO OOPHERECTOMY: SHX5935

## 2019-08-15 LAB — COMPREHENSIVE METABOLIC PANEL
ALT: 44 U/L (ref 0–44)
AST: 49 U/L — ABNORMAL HIGH (ref 15–41)
Albumin: 3.9 g/dL (ref 3.5–5.0)
Alkaline Phosphatase: 58 U/L (ref 38–126)
Anion gap: 8 (ref 5–15)
BUN: 6 mg/dL (ref 6–20)
CO2: 24 mmol/L (ref 22–32)
Calcium: 8.8 mg/dL — ABNORMAL LOW (ref 8.9–10.3)
Chloride: 107 mmol/L (ref 98–111)
Creatinine, Ser: 0.7 mg/dL (ref 0.44–1.00)
GFR calc Af Amer: >60 mL/min (ref >60)
GFR calc non Af Amer: >60 mL/min (ref >60)
Glucose, Bld: 112 mg/dL — ABNORMAL HIGH (ref 70–99)
Potassium: 3.9 mmol/L (ref 3.5–5.1)
Sodium: 139 mmol/L (ref 135–145)
Total Bilirubin: 0.7 mg/dL (ref 0.3–1.2)
Total Protein: 7.5 g/dL (ref 6.5–8.1)

## 2019-08-15 LAB — TYPE AND SCREEN
ABO/RH(D): O POS
Antibody Screen: NEGATIVE

## 2019-08-15 LAB — ABO/RH: ABO/RH(D): O POS

## 2019-08-15 LAB — CBC
HCT: 35.3 % — ABNORMAL LOW (ref 36.0–46.0)
Hemoglobin: 11.2 g/dL — ABNORMAL LOW (ref 12.0–15.0)
MCH: 26 pg (ref 26.0–34.0)
MCHC: 31.7 g/dL (ref 30.0–36.0)
MCV: 82.1 fL (ref 80.0–100.0)
Platelets: 173 10*3/uL (ref 150–400)
RBC: 4.3 MIL/uL (ref 3.87–5.11)
RDW: 14.4 % (ref 11.5–15.5)
WBC: 5.2 10*3/uL (ref 4.0–10.5)
nRBC: 0 % (ref 0.0–0.2)

## 2019-08-15 LAB — POCT PREGNANCY, URINE: Preg Test, Ur: NEGATIVE

## 2019-08-15 SURGERY — SALPINGO-OOPHORECTOMY, UNILATERAL, LAPAROSCOPIC
Anesthesia: General | Laterality: Left

## 2019-08-15 MED ORDER — BUPIVACAINE LIPOSOME 1.3 % IJ SUSP
INTRAMUSCULAR | Status: AC
  Filled 2019-08-15: qty 20

## 2019-08-15 MED ORDER — PHENYLEPHRINE HCL (PRESSORS) 10 MG/ML IV SOLN
INTRAVENOUS | Status: DC | PRN
  Administered 2019-08-15 (×2): 100 ug via INTRAVENOUS

## 2019-08-15 MED ORDER — BUPIVACAINE LIPOSOME 1.3 % IJ SUSP
INTRAMUSCULAR | Status: DC | PRN
  Administered 2019-08-15: 20 mL

## 2019-08-15 MED ORDER — DEXMEDETOMIDINE HCL IN NACL 200 MCG/50ML IV SOLN
INTRAVENOUS | Status: DC | PRN
  Administered 2019-08-15 (×2): 4 ug via INTRAVENOUS

## 2019-08-15 MED ORDER — FENTANYL CITRATE (PF) 100 MCG/2ML IJ SOLN
INTRAMUSCULAR | Status: AC
  Filled 2019-08-15: qty 2

## 2019-08-15 MED ORDER — ROCURONIUM BROMIDE 10 MG/ML (PF) SYRINGE
PREFILLED_SYRINGE | INTRAVENOUS | Status: AC
  Filled 2019-08-15: qty 10

## 2019-08-15 MED ORDER — EPHEDRINE SULFATE 50 MG/ML IJ SOLN
INTRAMUSCULAR | Status: DC | PRN
  Administered 2019-08-15: 5 mg via INTRAVENOUS
  Administered 2019-08-15: 10 mg via INTRAVENOUS

## 2019-08-15 MED ORDER — MIDAZOLAM HCL 2 MG/2ML IJ SOLN
INTRAMUSCULAR | Status: AC
  Filled 2019-08-15: qty 2

## 2019-08-15 MED ORDER — LIDOCAINE HCL (CARDIAC) PF 100 MG/5ML IV SOSY
PREFILLED_SYRINGE | INTRAVENOUS | Status: DC | PRN
  Administered 2019-08-15: 60 mg via INTRAVENOUS

## 2019-08-15 MED ORDER — DEXMEDETOMIDINE HCL IN NACL 80 MCG/20ML IV SOLN
INTRAVENOUS | Status: AC
  Filled 2019-08-15: qty 20

## 2019-08-15 MED ORDER — LIDOCAINE HCL (PF) 2 % IJ SOLN
INTRAMUSCULAR | Status: AC
  Filled 2019-08-15: qty 5

## 2019-08-15 MED ORDER — LACTATED RINGERS IV SOLN: INTRAVENOUS | Status: DC

## 2019-08-15 MED ORDER — KETOROLAC TROMETHAMINE 30 MG/ML IJ SOLN
INTRAMUSCULAR | Status: AC
  Filled 2019-08-15: qty 1

## 2019-08-15 MED ORDER — SEVOFLURANE IN SOLN
RESPIRATORY_TRACT | Status: AC
  Filled 2019-08-15: qty 250

## 2019-08-15 MED ORDER — IBUPROFEN 600 MG PO TABS: 600.0000 mg | ORAL_TABLET | Freq: Four times a day (QID) | ORAL | 3 refills | Status: DC | PRN

## 2019-08-15 MED ORDER — PROPOFOL 10 MG/ML IV BOLUS
INTRAVENOUS | Status: AC
  Filled 2019-08-15: qty 40

## 2019-08-15 MED ORDER — KETOROLAC TROMETHAMINE 30 MG/ML IJ SOLN
INTRAMUSCULAR | Status: DC | PRN
  Administered 2019-08-15: 30 mg via INTRAVENOUS

## 2019-08-15 MED ORDER — OXYCODONE HCL 5 MG PO TABS: 5.0000 mg | ORAL_TABLET | Freq: Four times a day (QID) | ORAL | 0 refills | Status: DC | PRN

## 2019-08-15 MED ORDER — DEXAMETHASONE SODIUM PHOSPHATE 10 MG/ML IJ SOLN
INTRAMUSCULAR | Status: AC
  Filled 2019-08-15: qty 1

## 2019-08-15 MED ORDER — ROCURONIUM BROMIDE 100 MG/10ML IV SOLN
INTRAVENOUS | Status: DC | PRN
  Administered 2019-08-15: 50 mg via INTRAVENOUS

## 2019-08-15 MED ORDER — ONDANSETRON HCL 4 MG/2ML IJ SOLN
INTRAMUSCULAR | Status: AC
  Filled 2019-08-15: qty 2

## 2019-08-15 MED ORDER — FENTANYL CITRATE (PF) 100 MCG/2ML IJ SOLN
INTRAMUSCULAR | Status: DC | PRN
  Administered 2019-08-15 (×2): 50 ug via INTRAVENOUS

## 2019-08-15 MED ORDER — ONDANSETRON HCL 4 MG/2ML IJ SOLN
INTRAMUSCULAR | Status: DC | PRN
  Administered 2019-08-15: 4 mg via INTRAVENOUS

## 2019-08-15 MED ORDER — PROPOFOL 10 MG/ML IV BOLUS
INTRAVENOUS | Status: DC | PRN
  Administered 2019-08-15: 150 mg via INTRAVENOUS

## 2019-08-15 MED ORDER — SUGAMMADEX SODIUM 200 MG/2ML IV SOLN
INTRAVENOUS | Status: DC | PRN
  Administered 2019-08-15: 100 mg via INTRAVENOUS

## 2019-08-15 MED ORDER — FAMOTIDINE 20 MG PO TABS
20.0000 mg | ORAL_TABLET | Freq: Once | ORAL | Status: AC
  Administered 2019-08-15: 20 mg via ORAL

## 2019-08-15 MED ORDER — ONDANSETRON HCL 4 MG/2ML IJ SOLN: 4.0000 mg | Freq: Once | INTRAMUSCULAR | Status: DC | PRN

## 2019-08-15 MED ORDER — FENTANYL CITRATE (PF) 100 MCG/2ML IJ SOLN
25.0000 ug | INTRAMUSCULAR | Status: DC | PRN
  Administered 2019-08-15 (×3): 25 ug via INTRAVENOUS

## 2019-08-15 MED ORDER — MIDAZOLAM HCL 2 MG/2ML IJ SOLN
INTRAMUSCULAR | Status: DC | PRN
  Administered 2019-08-15: 2 mg via INTRAVENOUS

## 2019-08-15 MED ORDER — OXYCODONE HCL 5 MG PO TABS
ORAL_TABLET | ORAL | Status: AC
  Administered 2019-08-15: 5 mg
  Filled 2019-08-15: qty 1

## 2019-08-15 MED ORDER — FENTANYL CITRATE (PF) 100 MCG/2ML IJ SOLN
INTRAMUSCULAR | Status: AC
  Administered 2019-08-15: 25 ug via INTRAVENOUS
  Filled 2019-08-15: qty 2

## 2019-08-15 MED ORDER — DEXAMETHASONE SODIUM PHOSPHATE 10 MG/ML IJ SOLN
INTRAMUSCULAR | Status: DC | PRN
  Administered 2019-08-15: 10 mg via INTRAVENOUS

## 2019-08-15 MED ORDER — THROMBIN 5000 UNITS EX SOLR
CUTANEOUS | Status: AC
  Filled 2019-08-15: qty 5000

## 2019-08-15 MED ORDER — THROMBIN 5000 UNITS EX SOLR
CUTANEOUS | Status: DC | PRN
  Administered 2019-08-15: 5000 [IU] via TOPICAL

## 2019-08-15 MED ORDER — FAMOTIDINE 20 MG PO TABS
ORAL_TABLET | ORAL | Status: AC
  Filled 2019-08-15: qty 1

## 2019-08-15 SURGICAL SUPPLY — 40 items
ANCHOR TIS RET SYS 1550ML (BAG) ×1 IMPLANT
APPLICATOR SURGIFLO ENDO (HEMOSTASIS) ×1 IMPLANT
BAG URINE DRAIN 2000ML AR STRL (UROLOGICAL SUPPLIES) ×2 IMPLANT
BASIN GRAD PLASTIC 32OZ STRL (MISCELLANEOUS) ×1 IMPLANT
BLADE SURG SZ11 CARB STEEL (BLADE) ×2 IMPLANT
CANNULA DILATOR 5 W/SLV (CANNULA) ×1 IMPLANT
CATH FOL 2WAY LX 16X5 (CATHETERS) ×2 IMPLANT
CHLORAPREP W/TINT 26 (MISCELLANEOUS) ×2 IMPLANT
COVER WAND RF STERILE (DRAPES) ×2 IMPLANT
DEFOGGER SCOPE WARMER CLEARIFY (MISCELLANEOUS) ×1 IMPLANT
DERMABOND ADVANCED (GAUZE/BANDAGES/DRESSINGS) ×1
DERMABOND ADVANCED .7 DNX12 (GAUZE/BANDAGES/DRESSINGS) ×1 IMPLANT
DRSG TEGADERM 2-3/8X2-3/4 SM (GAUZE/BANDAGES/DRESSINGS) ×2 IMPLANT
ELECT REM PT RETURN 9FT ADLT (ELECTROSURGICAL) ×2
ELECTRODE REM PT RTRN 9FT ADLT (ELECTROSURGICAL) ×1 IMPLANT
GAUZE 4X4 16PLY RFD (DISPOSABLE) ×1 IMPLANT
GLOVE BIOGEL PI IND STRL 6.5 (GLOVE) ×1 IMPLANT
GLOVE BIOGEL PI INDICATOR 6.5 (GLOVE) ×1
GLOVE SURG SYN 6.5 ES PF (GLOVE) ×8 IMPLANT
GLOVE SURG SYN 6.5 PF PI (GLOVE) ×1 IMPLANT
GOWN STRL REUS W/ TWL LRG LVL3 (GOWN DISPOSABLE) ×2 IMPLANT
GOWN STRL REUS W/TWL LRG LVL3 (GOWN DISPOSABLE) ×2
KIT PINK PAD W/HEAD ARE REST (MISCELLANEOUS) ×2
KIT PINK PAD W/HEAD ARM REST (MISCELLANEOUS) ×1 IMPLANT
KIT TURNOVER CYSTO (KITS) ×2 IMPLANT
LABEL OR SOLS (LABEL) ×2 IMPLANT
NDL INSUFF ACCESS 14 VERSASTEP (NEEDLE) ×1 IMPLANT
NS IRRIG 500ML POUR BTL (IV SOLUTION) ×2 IMPLANT
PACK GYN LAPAROSCOPIC (MISCELLANEOUS) ×2 IMPLANT
PAD OB MATERNITY 4.3X12.25 (PERSONAL CARE ITEMS) ×2 IMPLANT
PAD PREP 24X41 OB/GYN DISP (PERSONAL CARE ITEMS) ×2 IMPLANT
SET TUBE SMOKE EVAC HIGH FLOW (TUBING) ×2 IMPLANT
SHEARS HARMONIC ACE PLUS 36CM (ENDOMECHANICALS) ×1 IMPLANT
SLEEVE ENDOPATH XCEL 5M (ENDOMECHANICALS) ×4 IMPLANT
SURGIFLO W/THROMBIN 8M KIT (HEMOSTASIS) ×1 IMPLANT
SUT MNCRL AB 4-0 PS2 18 (SUTURE) ×2 IMPLANT
SUT VICRYL 0 AB UR-6 (SUTURE) ×1 IMPLANT
SYR 10ML LL (SYRINGE) ×2 IMPLANT
TROCAR BLADELESS 15MM (ENDOMECHANICALS) ×1 IMPLANT
TROCAR XCEL NON-BLD 5MMX100MML (ENDOMECHANICALS) ×2 IMPLANT

## 2019-08-15 NOTE — Transfer of Care (Signed)
Immediate Anesthesia Transfer of Care Note  Patient: Abigail Cunningham  Procedure(s) Performed: LAPAROSCOPIC UNILATERAL SALPINGO OOPHORECTOMY (Left )  Patient Location: PACU  Anesthesia Type:General  Level of Consciousness: awake and drowsy  Airway & Oxygen Therapy: Patient Spontanous Breathing and Patient connected to face mask oxygen  Post-op Assessment: Report given to RN and Post -op Vital signs reviewed and stable  Post vital signs: Reviewed and stable  Last Vitals:  Vitals Value Taken Time  BP 115/65 08/15/19 1441  Temp    Pulse 55 08/15/19 1443  Resp 13 08/15/19 1443  SpO2 100 % 08/15/19 1443  Vitals shown include unvalidated device data.  Last Pain:  Vitals:   08/15/19 1022  TempSrc: Temporal  PainSc: 5          Complications: No apparent anesthesia complications

## 2019-08-15 NOTE — Op Note (Signed)
Operative Note   08/15/2019  PRE-OP DIAGNOSIS: Left Ovarian Cyst  POST-OP DIAGNOSIS: same   PROCEDURE: Procedure(s): LAPAROSCOPIC UNILATERAL (LEFT) SALPINGO OOPHORECTOMY   SURGEON: Adrian Prows MD  ASSISTANT: Prentice Docker MD  ANESTHESIA: Choice   ESTIMATED BLOOD LOSS: 10 cc  COMPLICATIONS: None  DISPOSITION: PACU - hemodynamically stable.  CONDITION: stable  FINDINGS: Laparoscopic survey of the abdomen revealed a enlarge left ovary which was adherent to the left cornua of the uterus, grossly normal uterus and right ovary. Cirrhotic liver edge. Gallbladder and appendix not visualized. Small omental adhesion the the uterus and left ovary.   PROCEDURE IN DETAIL: The patient was taken to the OR where anesthesia was administed. The patient was positioned in dorsal lithotomy in the [place]. The patient was then examined under anesthesia with the above noted findings. The patient was prepped and draped in the normal sterile fashion and bladder was drained using a foley catheter.  Attention was turned to the patient's abdomen where a 5 mm skin incision was made in the midclavicular line 2 fingerbreadths underneath the rib cage, after injection of Exparel. The 5 mm port was then placed under direct visualization with the operative laparoscope  The above noted findings. Pneumoperitoneum was obtained. Patient positioned into Trendelenburg.  Ovarian mass identified.  The step up port system was used to introduce a port directly into the ovarian mass to prevent spillage of ovarian contents.  The suction irrigator was used to remove clear through fluid from the ovarian mass.  3200 cc of serosanguineous fluid was removed in total.   A 15 mm trocar was then placed in the left  lower quadrants under direct visualization with the laparoscope.  A 5 mm right lower quadrant trocar was placed under direct visualization with the laparoscope.  The left ovary and fallopian tube were then  elevated with atraumatic graspers.  Care was taken so that there was no drainage of fluid from the cyst.  Small omental adhesions to the left ovary uterine fundus were noted.  The harmonic scalpel was used to coagulate and transect these adhesions.  Excellent hemostasis was noted. The harmonic scalpel was then used to coagulate and transect the infundibulopelvic ligament.  The left ovary and fallopian tube were then removed using the harmonic scalpel to the level of the left uterine cornua.  The ovary was somewhat adherent to the left uterine cornua and dissection using the harmonic scalpel was performed.  Once the left ovary and fallopian tube were able to be removed the ovary and fallopian tube were placed into a large laparoscopic bag.  This bag was then removed to the surface of the skin and the ovary and fallopian tube were morcellated with the Mayo scissors inside the surgical bag until they could be entirely removed.   Attention was then returned to the abdomen there is a small amount of bleeding at the cornua of the uterus.  The bipolar Kleppinger cautery was used to establish hemostasis.  The abdomen was copiously irrigated with normal saline. Surgi-Flo was applied to the cauterized bed of the left uterine cornua.  Excellent hemostasis was obtained.  The 15 mm left lower quadrant port was then closed using the [employer] device.  The abdomen was again inspected and hemostasis was noted throughout. All instruments and ports were then removed from the abdomen after gas was expelled and patient was leveled.   The skin was closed with 4-0 monocryl and skin adhesive. The foley catheter was removed from the bladder. The  patient tolerated the procedure well. All counts were correct x 2. The patient was transferred to the recovery room awake, alert and breathing independently.  Dr. Prentice Docker assisted in this case.  This was a high-level case requiring Insurance risk surveyor.  Dr. Glennon Mac assisted  with placement of surgical ports, manipulation of tissues, operation of the laparoscopic camera, and operation of the harmonic scalpel.  No residents were available to assist with this case.  Adrian Prows MD [facility] 08/15/2019 2:42 PM

## 2019-08-15 NOTE — Discharge Instructions (Addendum)
Laparoscopic Left Oophorectomy and Left Salpingectomy, Care After This sheet gives you information about how to care for yourself after your procedure. Your health care provider may also give you more specific instructions. If you have problems or questions, contact your health care provider. What can I expect after the procedure? After the procedure, it is common to have:  A sore throat.  Discomfort in your shoulder.  Mild discomfort or cramping in your abdomen.  Gas pains.  Pain or soreness in the area where the surgical incision was made.  A bloated feeling.  Tiredness.  Nausea.  Vomiting. Follow these instructions at home: Medicines  Take over-the-counter and prescription medicines only as told by your health care provider.  Do not take aspirin because it can cause bleeding.  Ask your health care provider if the medicine prescribed to you: ? Requires you to avoid driving or using heavy machinery. ? Can cause constipation. You may need to take actions to prevent or treat constipation, such as:  Drink enough fluid to keep your urine pale yellow.  Take over-the-counter or prescription medicines.  Eat foods that are high in fiber, such as beans, whole grains, and fresh fruits and vegetables.  Limit foods that are high in fat and processed sugars, such as fried or sweet foods. Incision care      Follow instructions from your health care provider about how to take care of your incision. Make sure you: ? Wash your hands with soap and water before and after you change your bandage (dressing). If soap and water are not available, use hand sanitizer. ? Change your dressing as told by your health care provider. ? Leave stitches (sutures), skin glue, or adhesive strips in place. These skin closures may need to stay in place for 2 weeks or longer. If adhesive strip edges start to loosen and curl up, you may trim the loose edges. Do not remove adhesive strips completely unless  your health care provider tells you to do that.  Check your incision area every day for signs of infection. Check for: ? Redness, swelling, or pain. ? Fluid or blood. ? Warmth. ? Pus or a bad smell. Activity  Rest as told by your health care provider.  Avoid sitting for a long time without moving. Get up to take short walks every 1-2 hours. This is important to improve blood flow and breathing. Ask for help if you feel weak or unsteady.  Return to your normal activities as told by your health care provider. Ask your health care provider what activities are safe for you. General instructions  Do not take baths, swim, or use a hot tub until your health care provider approves. Ask your health care provider if you may take showers. You may only be allowed to take sponge baths.  Have someone help you with your daily household tasks for the first few days.  Keep all follow-up visits as told by your health care provider. This is important. Contact a health care provider if:  You have redness, swelling, or pain around your incision.  Your incision feels warm to the touch.  You have pus or a bad smell coming from your incision.  The edges of your incision break open after the sutures have been removed.  Your pain does not improve after 2-3 days.  You have a rash.  You repeatedly become dizzy or light-headed.  Your pain medicine is not helping. Get help right away if you:  Have a fever.  Faint.  Have increasing pain in your abdomen.  Have severe pain in one or both of your shoulders.  Have fluid or blood coming from your sutures or from your vagina.  Have shortness of breath or difficulty breathing.  Have chest pain or leg pain.  Have ongoing nausea, vomiting, or diarrhea. Summary  After the procedure, it is common to have mild discomfort or cramping in your abdomen.  Take over-the-counter and prescription medicines only as told by your health care provider.  Watch  for symptoms that should prompt you to call your health care provider.  Keep all follow-up visits as told by your health care provider. This is important. This information is not intended to replace advice given to you by your health care provider. Make sure you discuss any questions you have with your health care provider. Document Revised: 10/09/2018 Document Reviewed: 03/27/2018 Elsevier Patient Education  2020 [place]   1) The drugs that you were given will stay in your system until tomorrow so for the next 24 hours you should not:  A) Drive an automobile B) Make any legal decisions C) Drink any alcoholic beverage   2) You may resume regular meals tomorrow.  Today it is better to start with liquids and gradually work up to solid foods.  You may eat anything you prefer, but it is better to start with liquids, then soup and crackers, and gradually work up to solid foods.   3) Please notify your doctor immediately if you have any unusual bleeding, trouble breathing, redness and pain at the surgery site, drainage, fever, or pain not relieved by medication.    4) Additional Instructions:        Please contact your physician with any problems or Same Day Surgery at [phone], Monday through Friday 6 am to 4 pm, or [place] at [facility] number at [phone].

## 2019-08-15 NOTE — Anesthesia Preprocedure Evaluation (Addendum)
Anesthesia Evaluation  Patient identified by MRN, date of birth, ID band Patient awake    Reviewed: Allergy & Precautions, H&P , NPO status , Patient's Chart, lab work & pertinent test results, reviewed documented beta blocker date and time   Airway Mallampati: II  TM Distance: >3 FB Neck ROM: full    Dental  (+) Teeth Intact   Pulmonary asthma , Current Smoker and Patient abstained from smoking.,    Pulmonary exam normal        Cardiovascular Exercise Tolerance: Good negative cardio ROS Normal cardiovascular exam Rhythm:regular Rate:Normal     Neuro/Psych  Headaches, PSYCHIATRIC DISORDERS    GI/Hepatic negative GI ROS, (+) Hepatitis -  Endo/Other  negative endocrine ROS  Renal/GU Renal disease  negative genitourinary   Musculoskeletal   Abdominal   Peds  Hematology negative hematology ROS (+)   Anesthesia Other Findings Past Medical History: No date: Asthma No date: Borderline personality disorder ([facility]) unk: Bronchitis No date: Chronic hepatitis ([facility]) No date: Generalized OA No date: Immune deficiency disorder ([facility])     Comment:  chronic autoimmune hepatitis per pt No date: Kidney stone No date: Migraine Past Surgical History: No date: ABDOMINAL HYSTERECTOMY No date: LITHOTRIPSY No date: OVARIAN CYST REMOVAL BMI    Body Mass Index: 36.31 kg/m     Reproductive/Obstetrics negative OB ROS                             Anesthesia Physical Anesthesia Plan  ASA: III  Anesthesia Plan: General ETT   Post-op Pain Management:    Induction:   PONV Risk Score and Plan:   Airway Management Planned:   Additional Equipment:   Intra-op Plan:   Post-operative Plan:   Informed Consent: I have reviewed the patients History and Physical, chart, labs and discussed the procedure including the risks, benefits and alternatives for the proposed anesthesia with the patient or authorized  representative who has indicated his/her understanding and acceptance.     Dental Advisory Given  Plan Discussed with: CRNA  Anesthesia Plan Comments:         Anesthesia Quick Evaluation

## 2019-08-15 NOTE — Interval H&P Note (Signed)
History and Physical Interval Note:  08/15/2019 12:06 PM  Abigail Cunningham  has presented today for surgery, with the diagnosis of Left ovarian cyst N83.202.  The various methods of treatment have been discussed with the patient and family. After consideration of risks, benefits and other options for treatment, the patient has consented to  Procedure(s): LAPAROSCOPIC UNILATERAL SALPINGO OOPHORECTOMY (Left) as a surgical intervention.  The patient's history has been reviewed, patient examined, no change in status, stable for surgery.  I have reviewed the patient's chart and labs.  Questions were answered to the patient's satisfaction.     [place]

## 2019-08-15 NOTE — Anesthesia Postprocedure Evaluation (Signed)
Anesthesia Post Note  Patient: Abigail Cunningham  Procedure(s) Performed: LAPAROSCOPIC UNILATERAL SALPINGO OOPHORECTOMY (Left )  Patient location during evaluation: PACU Anesthesia Type: General Level of consciousness: awake and alert Pain management: pain level controlled Vital Signs Assessment: post-procedure vital signs reviewed and stable Respiratory status: spontaneous breathing, nonlabored ventilation, respiratory function stable and patient connected to nasal cannula oxygen Cardiovascular status: blood pressure returned to baseline and stable Postop Assessment: no apparent nausea or vomiting Anesthetic complications: no     Last Vitals:  Vitals:   08/15/19 1456 08/15/19 1511  BP: 122/70 102/70  Pulse: (!) 50 (!) 54  Resp: 17 16  Temp:    SpO2: 100% 100%    Last Pain:  Vitals:   08/15/19 1520  TempSrc:   PainSc: 8                  Arita Miss

## 2019-08-15 NOTE — Anesthesia Procedure Notes (Signed)
Procedure Name: Intubation Date/Time: 08/15/2019 12:19 PM Performed by: Gentry Fitz, CRNA Pre-anesthesia Checklist: Patient identified, Emergency Drugs available, Suction available and Patient being monitored Patient Re-evaluated:Patient Re-evaluated prior to induction Oxygen Delivery Method: Circle system utilized Preoxygenation: Pre-oxygenation with 100% oxygen Induction Type: IV induction Ventilation: Mask ventilation without difficulty Laryngoscope Size: Mac and 4 Grade View: Grade I Tube type: Oral Tube size: 7.0 mm Number of attempts: 1 Airway Equipment and Method: Stylet Placement Confirmation: ETT inserted through vocal cords under direct vision,  positive ETCO2 and breath sounds checked- equal and bilateral Secured at: 22 cm Tube secured with: Tape Dental Injury: Teeth and Oropharynx as per pre-operative assessment

## 2019-08-20 LAB — SURGICAL PATHOLOGY

## 2019-08-21 ENCOUNTER — Other Ambulatory Visit: Payer: Self-pay

## 2019-08-21 ENCOUNTER — Inpatient Hospital Stay: Payer: Managed Care, Other (non HMO)

## 2019-08-21 ENCOUNTER — Ambulatory Visit (INDEPENDENT_AMBULATORY_CARE_PROVIDER_SITE_OTHER): Payer: Managed Care, Other (non HMO) | Admitting: Obstetrics and Gynecology

## 2019-08-21 ENCOUNTER — Encounter: Payer: Self-pay | Admitting: Obstetrics and Gynecology

## 2019-08-21 VITALS — BP 102/60 | Ht 63.0 in | Wt 199.0 lb

## 2019-08-21 DIAGNOSIS — R141 Gas pain: Secondary | ICD-10-CM

## 2019-08-21 MED ORDER — SIMETHICONE 80 MG PO CHEW: 80.0000 mg | CHEWABLE_TABLET | Freq: Four times a day (QID) | ORAL | 0 refills | Status: DC | PRN

## 2019-08-21 MED ORDER — METOCLOPRAMIDE HCL 10 MG PO TABS: 10.0000 mg | ORAL_TABLET | Freq: Three times a day (TID) | ORAL | 1 refills | Status: DC

## 2019-08-21 NOTE — Progress Notes (Signed)
  Postoperative Follow-up Patient presents post op from laparoscopic left oophorectomy for adnexal mass, 1 week ago.  Subjective: Patient reports some improvement in her preop symptoms. Eating a regular diet without difficulty. Pain is controlled with current analgesics. Medications being used: ibuprofen (OTC) and narcotic analgesics including roxicodone.  Activity: normal activities of daily living. Patient reports additional symptom's since surgery of nausea for 3 days. Had a bowel movement once since surgery 2 days ago. Denies vomiting. Just taking narcotic pills at night.   Objective: BP 102/60   Ht 5\' 3"  (1.6 m)   Wt 199 lb (90.3 kg)   LMP 08/06/2019 (Exact Date)   BMI 35.25 kg/m  Physical Exam Constitutional:      Appearance: She is well-developed.  Genitourinary:     Vagina and uterus normal.     No lesions in the vagina.     No cervical motion tenderness.     No right or left adnexal mass present.  HENT:     Head: Normocephalic and atraumatic.  Neck:     Thyroid: No thyromegaly.  Cardiovascular:     Rate and Rhythm: Normal rate and regular rhythm.     Heart sounds: Normal heart sounds.  Pulmonary:     Effort: Pulmonary effort is normal.     Breath sounds: Normal breath sounds.  Chest:     Breasts:        Right: No inverted nipple, mass, nipple discharge or skin change.        Left: No inverted nipple, mass, nipple discharge or skin change.  Abdominal:     General: Bowel sounds are normal. There is no distension.     Palpations: Abdomen is soft. There is no mass.     Tenderness: There is abdominal tenderness. There is no guarding or rebound.     Comments: Normal bowel sounds in all 4 quadrants. Incisions intact and healing well. No erythema. No induration.  Musculoskeletal:     Cervical back: Neck supple.  Neurological:     Mental Status: She is alert and oriented to person, place, and time.  Skin:    General: Skin is warm and dry.  Psychiatric:        Behavior:  Behavior normal.        Thought Content: Thought content normal.        Judgment: Judgment normal.  Vitals reviewed.    Assessment: s/p :  laparoscopic left oophorectomy stable  Plan: Patient has done well after surgery with no apparent complications.  I have discussed the post-operative course to date, and the expected progress moving forward.  The patient understands what complications to be concerned about.  I will see the patient in routine follow up, or sooner if needed.    Will start reglan 3 times a day. Mylicon 2-4 times a day as needed.  Patient asked to come to the ER if she is having vomiting.  Asked to call the office is she has not had a bowel movement by Friday.   Activity plan: Limit diet to light foood and small meals until more regular bowel movements. No heavy lifting.  Pelvic rest.  Malcome Ambrocio R Daelynn Blower 08/21/2019, 11:43 AM

## 2019-08-24 ENCOUNTER — Ambulatory Visit: Payer: Managed Care, Other (non HMO) | Attending: Internal Medicine

## 2019-08-24 DIAGNOSIS — Z23 Encounter for immunization: Secondary | ICD-10-CM

## 2019-08-24 NOTE — Progress Notes (Signed)
   Covid-19 Vaccination Clinic  Name:  Abigail Cunningham    MRN: [id number] DOB: [birth year]  08/24/2019  [name] was observed post Covid-19 immunization for 15 minutes without incident. She was provided with Vaccine Information Sheet and instruction to access the V-Safe system.   [name] was instructed to call 911 with any severe reactions post vaccine: Marland Kitchen Difficulty breathing  . Swelling of face and throat  . A fast heartbeat  . A bad rash all over body  . Dizziness and weakness   Immunizations Administered    Name Date Dose VIS Date Route   Pfizer COVID-19 Vaccine 08/24/2019  4:06 PM 0.3 mL 04/26/2019 Intramuscular   Manufacturer: [place]   Lot: [id number]   [place]: [id number]

## 2019-09-10 ENCOUNTER — Ambulatory Visit: Payer: Managed Care, Other (non HMO)

## 2019-09-17 ENCOUNTER — Telehealth: Payer: Self-pay | Admitting: Gastroenterology

## 2019-09-17 NOTE — Telephone Encounter (Signed)
Spoke to patient and she is having leg pain in both legs mostly at night (0-10 is 9) What does she need to do? Please call patient.

## 2019-09-18 ENCOUNTER — Telehealth: Payer: Self-pay

## 2019-09-18 NOTE — Telephone Encounter (Signed)
Patient stated that she had been having leg cramping on bilateral legs for the past two days. Patient stated that she had not been able to sleep at night due to the cramping. Patient denied fever at touch. Patient had a Laparoscopic unilateral salpingo oophorectomy surgery on 08/15/2019 by Dr. Gilman Schmidt. Patient is scheduled to have a post-op appointment with Dr. Gilman Schmidt on 09/20/2019. Patient had not contacted anybody else about this but [place]. Please advise.

## 2019-09-18 NOTE — Telephone Encounter (Signed)
Called patient and left her a detailed message letting her know that she will be needing to call her PCP or Dr. Gilman Schmidt to let them know what symptoms she is currently having since Dr. Bonna Gains does not think that this is GI related. I left our phone number to call me back in case she had further questions.

## 2019-09-20 ENCOUNTER — Other Ambulatory Visit: Payer: Self-pay

## 2019-09-20 ENCOUNTER — Ambulatory Visit: Payer: Managed Care, Other (non HMO) | Admitting: Obstetrics and Gynecology

## 2019-09-24 DIAGNOSIS — T1491XA Suicide attempt, initial encounter: Secondary | ICD-10-CM | POA: Insufficient documentation

## 2019-09-24 DIAGNOSIS — N393 Stress incontinence (female) (male): Secondary | ICD-10-CM | POA: Insufficient documentation

## 2019-09-24 DIAGNOSIS — G43909 Migraine, unspecified, not intractable, without status migrainosus: Secondary | ICD-10-CM | POA: Insufficient documentation

## 2019-09-24 DIAGNOSIS — N200 Calculus of kidney: Secondary | ICD-10-CM | POA: Insufficient documentation

## 2019-09-24 DIAGNOSIS — K219 Gastro-esophageal reflux disease without esophagitis: Secondary | ICD-10-CM | POA: Insufficient documentation

## 2019-09-24 DIAGNOSIS — J309 Allergic rhinitis, unspecified: Secondary | ICD-10-CM | POA: Insufficient documentation

## 2019-09-24 DIAGNOSIS — K754 Autoimmune hepatitis: Secondary | ICD-10-CM | POA: Insufficient documentation

## 2019-09-24 DIAGNOSIS — K649 Unspecified hemorrhoids: Secondary | ICD-10-CM | POA: Insufficient documentation

## 2019-09-26 ENCOUNTER — Other Ambulatory Visit: Payer: Self-pay

## 2019-09-26 ENCOUNTER — Encounter: Payer: Self-pay | Admitting: *Deleted

## 2019-09-26 ENCOUNTER — Encounter: Payer: Self-pay | Admitting: Gastroenterology

## 2019-09-26 ENCOUNTER — Ambulatory Visit: Payer: Managed Care, Other (non HMO) | Admitting: Gastroenterology

## 2019-09-26 VITALS — BP 141/85 | HR 76 | Temp 98.6°F | Wt 192.8 lb

## 2019-09-26 DIAGNOSIS — K754 Autoimmune hepatitis: Secondary | ICD-10-CM

## 2019-09-26 MED ORDER — OMEPRAZOLE 40 MG PO CPDR: 40.0000 mg | DELAYED_RELEASE_CAPSULE | Freq: Every day | ORAL | 0 refills | Status: DC

## 2019-09-27 ENCOUNTER — Telehealth: Payer: Self-pay

## 2019-09-27 DIAGNOSIS — D509 Iron deficiency anemia, unspecified: Secondary | ICD-10-CM

## 2019-09-27 NOTE — Progress Notes (Signed)
Abigail Antigua, MD [address]  [place]  [place], [place] [address]  Main: [phone]  Fax: [phone]   Primary Care Physician: Maryland Pink, MD   Chief Complaint  Patient presents with  . Autoimmune hepatitis    Patient stated that she had not slept for 2 days, leg cramping and  nerve pain, rash on left ankle, nausea but no vomiting.    HPI: Abigail Cunningham is a 39 y.o. female with reported history of autoimmune hepatitis, previously seen at [facility] and [place] 13 to 15 years ago and has not followed up with GI since then, here for follow-up.  On previous visit records were requested but have not been obtained.  Patient has not been on any medications for this and over the last decade.  No prior EGD or colonoscopy.    Reports heartburn  Previous history:  Describes being on steroids previously as well.  I do not have her previous records.  Patient states a liver biopsy was also done at that time.  Denies any upper endoscopies for screening.  Denies any previous episodes of bleeding or confusion.  Current Outpatient Medications  Medication Sig Dispense Refill  . omeprazole (PRILOSEC) 40 MG capsule Take 1 capsule (40 mg total) by mouth daily. 30 capsule 0   No current facility-administered medications for this visit.    Allergies as of 09/26/2019 - Review Complete 09/26/2019  Allergen Reaction Noted  . Haldol [haloperidol lactate] Other (See Comments) 10/23/2014    ROS:  General: Negative for anorexia, weight loss, fever, chills, fatigue, weakness. ENT: Negative for hoarseness, difficulty swallowing , nasal congestion. CV: Negative for chest pain, angina, palpitations, dyspnea on exertion, peripheral edema.  Respiratory: Negative for dyspnea at rest, dyspnea on exertion, cough, sputum, wheezing.  GI: See history of present illness. GU:  Negative for dysuria, hematuria, urinary incontinence, urinary frequency, nocturnal urination.  Endo: Negative for  unusual weight change.    Physical Examination:   BP (!) 141/85   Pulse 76   Temp 98.6 F (37 C) (Oral)   Wt 192 lb 12.8 oz (87.5 kg)   BMI 34.15 kg/m   General: Well-nourished, well-developed in no acute distress.  Eyes: No icterus. Conjunctivae pink. Mouth: Oropharyngeal mucosa moist and pink , no lesions erythema or exudate. Neck: Supple, Trachea midline Abdomen: Bowel sounds are normal, nontender, nondistended, no hepatosplenomegaly or masses, no abdominal bruits or hernia , no rebound or guarding.   Extremities: No lower extremity edema. No clubbing or deformities. Neuro: Alert and oriented x 3.  Grossly intact. Skin: Warm and dry, no jaundice.   Psych: Alert and cooperative, normal mood and affect.   Labs: CMP     Component Value Date/Time   NA 139 08/15/2019 1016   NA 137 01/07/2013 0112   K 3.9 08/15/2019 1016   K 3.6 01/07/2013 0112   CL 107 08/15/2019 1016   CL 107 01/07/2013 0112   CO2 24 08/15/2019 1016   CO2 24 01/07/2013 0112   GLUCOSE 112 (H) 08/15/2019 1016   GLUCOSE 94 01/07/2013 0112   BUN 6 08/15/2019 1016   BUN 9 01/07/2013 0112   CREATININE 0.70 08/15/2019 1016   CREATININE 0.71 01/07/2013 0112   CALCIUM 8.8 (L) 08/15/2019 1016   CALCIUM 8.9 01/07/2013 0112   PROT 7.6 09/26/2019 1529   PROT 7.9 08/19/2012 1457   ALBUMIN 4.2 09/26/2019 1529   ALBUMIN 4.1 08/19/2012 1457   AST 127 (H) 09/26/2019 1529   AST 75 (  H) 08/19/2012 1457   ALT 81 (H) 09/26/2019 1529   ALT 101 (H) 08/19/2012 1457   ALKPHOS 74 09/26/2019 1529   ALKPHOS 70 08/19/2012 1457   BILITOT 0.5 09/26/2019 1529   BILITOT 0.3 08/19/2012 1457   GFRNONAA >60 08/15/2019 1016   GFRNONAA >60 01/07/2013 0112   GFRAA >60 08/15/2019 1016   GFRAA >60 01/07/2013 0112   Lab Results  Component Value Date   WBC 5.2 08/15/2019   HGB 11.2 (L) 08/15/2019   HCT 35.3 (L) 08/15/2019   MCV 82.1 08/15/2019   PLT 173 08/15/2019    Imaging Studies: No results found.  Assessment and Plan:     Abigail Cunningham is a 39 y.o. 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  Last imaging did have evidence of cirrhosis.  We will try to obtain records from [place] again in regard to her history.  After labs are done, and previous records obtained, patient will likely need variceal screening EGD  Avoid hepatotoxic drugs  PPI once daily for daily heartburn    Dr Abigail Cunningham

## 2019-09-27 NOTE — Telephone Encounter (Signed)
Called patient and had to leave her a voicemail to return my call.  Per Dr. Bonna Gains: please let the patient know, her labs show iron deficiency and refer her to Dr. Janese Banks. Some of her other labwork is still pending and we will await that for further instructions.

## 2019-09-28 LAB — NASH FIBROSURE
ALPHA 2-MACROGLOBULINS, QN: 232 mg/dL (ref 110–276)
ALT (SGPT) P5P: 91 IU/L — ABNORMAL HIGH (ref 0–40)
AST (SGOT) P5P: 140 IU/L — ABNORMAL HIGH (ref 0–40)
Apolipoprotein A-1: 129 mg/dL (ref 116–209)
Bilirubin, Total: 0.4 mg/dL (ref 0.0–1.2)
Cholesterol, Total: 172 mg/dL (ref 100–199)
Fibrosis Score: 0.27 — ABNORMAL HIGH (ref 0.00–0.21)
GGT: 52 IU/L (ref 0–60)
Glucose: 88 mg/dL (ref 65–99)
Haptoglobin: 86 mg/dL (ref 33–278)
Height: 60 in
NASH Score: 0.5 — ABNORMAL HIGH
Steatosis Score: 0.55 — ABNORMAL HIGH (ref 0.00–0.30)
Triglycerides: 177 mg/dL — ABNORMAL HIGH (ref 0–149)
Weight: 120 [lb_av]

## 2019-09-28 LAB — IRON AND TIBC
Iron Saturation: 7 % — CL (ref 15–55)
Iron: 34 ug/dL (ref 27–159)
Total Iron Binding Capacity: 471 ug/dL — ABNORMAL HIGH (ref 250–450)
UIBC: 437 ug/dL — ABNORMAL HIGH (ref 131–425)

## 2019-09-28 LAB — HEPATITIS B SURFACE ANTIBODY,QUALITATIVE: Hep B Surface Ab, Qual: NONREACTIVE

## 2019-09-28 LAB — FERRITIN: Ferritin: 21 ng/mL (ref 15–150)

## 2019-09-28 LAB — ANTI-MICROSOMAL ANTIBODY LIVER / KIDNEY: LKM1 Ab: 0.9 Units (ref 0.0–20.0)

## 2019-09-28 LAB — HEPATIC FUNCTION PANEL
ALT: 81 IU/L — ABNORMAL HIGH (ref 0–32)
AST: 127 IU/L — ABNORMAL HIGH (ref 0–40)
Albumin: 4.2 g/dL (ref 3.8–4.8)
Alkaline Phosphatase: 74 IU/L (ref 39–117)
Bilirubin Total: 0.5 mg/dL (ref 0.0–1.2)
Bilirubin, Direct: 0.18 mg/dL (ref 0.00–0.40)
Total Protein: 7.6 g/dL (ref 6.0–8.5)

## 2019-09-28 LAB — HEPATITIS B SURFACE ANTIGEN: Hepatitis B Surface Ag: NEGATIVE

## 2019-09-28 LAB — ANA: ANA Titer 1: NEGATIVE

## 2019-09-28 LAB — MITOCHONDRIAL/SMOOTH MUSCLE AB PNL
Mitochondrial Ab: <20 Units (ref 0.0–20.0)
Smooth Muscle Ab: 16 Units (ref 0–19)

## 2019-09-28 LAB — HEPATITIS B CORE ANTIBODY, TOTAL: Hep B Core Total Ab: NEGATIVE

## 2019-09-28 LAB — HEPATITIS A ANTIBODY, TOTAL: hep A Total Ab: NEGATIVE

## 2019-09-28 LAB — IGG 4: IgG, Subclass 4: 29 mg/dL (ref 2–96)

## 2019-09-28 LAB — HEPATITIS C ANTIBODY (REFLEX): HCV Ab: <0.1 s/co ratio (ref 0.0–0.9)

## 2019-09-28 LAB — CERULOPLASMIN: Ceruloplasmin: 27.2 mg/dL (ref 19.0–39.0)

## 2019-09-28 LAB — HCV COMMENT:

## 2019-09-30 ENCOUNTER — Telehealth: Payer: Self-pay

## 2019-09-30 NOTE — Telephone Encounter (Signed)
Release of information were faxed to [place] so we could get patient's records.

## 2019-10-01 ENCOUNTER — Telehealth: Payer: Self-pay

## 2019-10-01 NOTE — Telephone Encounter (Signed)
Patient has an appointment with Dr. Janese Banks on 10/03/19 to evaluate and treat her iron deficiency anemia.

## 2019-10-01 NOTE — Telephone Encounter (Signed)
-----   Message from Virgel Manifold, MD sent at 09/30/2019 12:08 PM EDT ----- Herb Grays please let the patient know, labs show elevated liver enzymes but the rest of the bloodwork is inconclusive for autoimmune hepatitis. We will need to review her previous records. Did we get any records yet?

## 2019-10-01 NOTE — Telephone Encounter (Signed)
Called patient but had to leave her a voicemail to call me back.

## 2019-10-03 ENCOUNTER — Other Ambulatory Visit: Payer: Self-pay

## 2019-10-03 ENCOUNTER — Inpatient Hospital Stay: Payer: Managed Care, Other (non HMO) | Attending: Oncology | Admitting: Oncology

## 2019-10-03 ENCOUNTER — Encounter: Payer: Self-pay | Admitting: Oncology

## 2019-10-03 ENCOUNTER — Telehealth: Payer: Self-pay | Admitting: *Deleted

## 2019-10-03 ENCOUNTER — Inpatient Hospital Stay: Payer: Managed Care, Other (non HMO)

## 2019-10-03 VITALS — BP 103/72 | HR 89 | Temp 98.6°F | Resp 18 | Ht 63.0 in | Wt 194.0 lb

## 2019-10-03 DIAGNOSIS — Z79899 Other long term (current) drug therapy: Secondary | ICD-10-CM | POA: Diagnosis not present

## 2019-10-03 DIAGNOSIS — J45909 Unspecified asthma, uncomplicated: Secondary | ICD-10-CM | POA: Insufficient documentation

## 2019-10-03 DIAGNOSIS — F17210 Nicotine dependence, cigarettes, uncomplicated: Secondary | ICD-10-CM | POA: Diagnosis not present

## 2019-10-03 DIAGNOSIS — K754 Autoimmune hepatitis: Secondary | ICD-10-CM | POA: Diagnosis not present

## 2019-10-03 DIAGNOSIS — D509 Iron deficiency anemia, unspecified: Secondary | ICD-10-CM | POA: Diagnosis not present

## 2019-10-03 DIAGNOSIS — Z90721 Acquired absence of ovaries, unilateral: Secondary | ICD-10-CM | POA: Diagnosis not present

## 2019-10-03 DIAGNOSIS — Z90710 Acquired absence of both cervix and uterus: Secondary | ICD-10-CM | POA: Insufficient documentation

## 2019-10-03 DIAGNOSIS — F603 Borderline personality disorder: Secondary | ICD-10-CM | POA: Insufficient documentation

## 2019-10-03 NOTE — Progress Notes (Signed)
Legs hurt mostly at night but can hurt in day time also. Sob on exertion. Not have a good appetite- sometimes she does not feel like eating some days- due to fatigue.

## 2019-10-03 NOTE — Telephone Encounter (Signed)
Called the pt. And let her know that she will be getting feraheme and it is 2 appts for infusion and then will have appt 6 weeks from today with labs and see md. She is fine with this plan and she has no days or times that we need to avoid. She has my chart and will get the dates. She asked how long it will be before she gets my chart message and I told her it will come to her today and she will see all visits.

## 2019-10-03 NOTE — Progress Notes (Signed)
Hematology/Oncology Consult note [facility] Telephone:([phone] [phone]  Patient Care Team: Maryland Pink, MD as PCP - General (Family Medicine)   Name of the patient: Abigail Cunningham  [id number]  29-May-1980    Reason for referral-iron deficiency anemia   Referring physician-Dr. Bonna Gains Date of visit: 10/03/19   History of presenting illness- Patient is a 39 year old female with history of autoimmune hepatitis who was recently seen by Dr. Bonna Gains on 08/14/2019 as a transfer of care for autoimmune hepatitis.  As a part of the work-up she had CBC checked which showed mild anemia with an H&H of 11.2/35.3.  White count was normal at 5.2 and platelets were normal at 173.  Iron study showed a low iron saturation of 7% and elevated TIBC of 471.  Ferritin levels were low at 21.  She tested negative for hepatitis A, B, and C.  Patient also has a history of ovarian mass and was seen by GYN CT renal stone study in March 2021 had shown a 12.9 x 19 x 21.3 cm left adnexal cystic mass for which patient underwent left salpingo-oophorectomy.  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  Patient currently reports ongoing fatigue and bilateral leg pain as well as symptoms of restless leg especially at night.  She reports having heavy menstrual cycles with the first 3 out of 5 days.  Denies any bleeding in her stool or urine.  Reports taking ibuprofen for her leg pain  ECOG PS- 0  Pain scale- 0   Review of systems- Review of Systems  Constitutional: Positive for malaise/fatigue. Negative for chills, fever and weight loss.  HENT: Negative for congestion, ear discharge and nosebleeds.   Eyes: Negative for blurred vision.  Respiratory: Negative for cough, hemoptysis, sputum production, shortness of breath and wheezing.   Cardiovascular: Negative for chest pain, palpitations, orthopnea and claudication.  Gastrointestinal: Negative for  abdominal pain, blood in stool, constipation, diarrhea, heartburn, melena, nausea and vomiting.  Genitourinary: Negative for dysuria, flank pain, frequency, hematuria and urgency.  Musculoskeletal: Negative for back pain, joint pain and myalgias.       Leg pain  Skin: Negative for rash.  Neurological: Negative for dizziness, tingling, focal weakness, seizures, weakness and headaches.  Endo/Heme/Allergies: Does not bruise/bleed easily.  Psychiatric/Behavioral: Negative for depression and suicidal ideas. The patient does not have insomnia.     Allergies  Allergen Reactions  . Haldol [Haloperidol Lactate] Other (See Comments)    Shuts down 'system'    Patient Active Problem List   Diagnosis Date Noted  . Allergic rhinitis 09/24/2019  . Autoimmune hepatitis ([place]) 09/24/2019  . GERD (gastroesophageal reflux disease) 09/24/2019  . Hemorrhoid 09/24/2019  . Migraine 09/24/2019  . Nephrolithiasis 09/24/2019  . Stress incontinence in female 09/24/2019  . Suicide attempt ([place]) 09/24/2019  . Adnexal mass   . Left ovarian cyst 08/09/2019  . Acute pyelonephritis 02/03/2018     Past Medical History:  Diagnosis Date  . Asthma   . Borderline personality disorder ([place])   . Bronchitis unk  . Chronic hepatitis ([place])   . Generalized OA   . Immune deficiency disorder ([place])    chronic autoimmune hepatitis per pt  . Kidney stone   . Migraine      Past Surgical History:  Procedure Laterality Date  . ABDOMINAL HYSTERECTOMY    . LAPAROSCOPIC UNILATERAL SALPINGO OOPHERECTOMY Left 08/15/2019   Procedure: LAPAROSCOPIC UNILATERAL SALPINGO OOPHORECTOMY;  Surgeon: Homero Fellers, MD;  Location: [facility] ORS;  Service: Gynecology;  Laterality: Left;  . LITHOTRIPSY    . OVARIAN CYST REMOVAL      Social History   Socioeconomic History  . Marital status: Married    Spouse name: Not on file  . Number of children: Not on file  . Years of education: Not on file  . Highest education level: Not  on file  Occupational History  . Not on file  Tobacco Use  . Smoking status: Current Some Day Smoker    Packs/day: 1.00    Types: Cigarettes  . Smokeless tobacco: Never Used  . Tobacco comment: 1 cigarette per day   Substance and Sexual Activity  . Alcohol use: No  . Drug use: Never  . Sexual activity: Not Currently  Other Topics Concern  . Not on file  Social History Narrative  . Not on file   Social Determinants of Health   Financial Resource Strain:   . Difficulty of Paying Living Expenses:   Food Insecurity:   . Worried About Charity fundraiser in the Last Year:   . Arboriculturist in the Last Year:   Transportation Needs:   . Film/video editor (Medical):   Marland Kitchen Lack of Transportation (Non-Medical):   Physical Activity:   . Days of Exercise per Week:   . Minutes of Exercise per Session:   Stress:   . Feeling of Stress :   Social Connections:   . Frequency of Communication with Friends and Family:   . Frequency of Social Gatherings with Friends and Family:   . Attends Religious Services:   . Active Member of Clubs or Organizations:   . Attends Archivist Meetings:   Marland Kitchen Marital Status:   Intimate Partner Violence:   . Fear of Current or Ex-Partner:   . Emotionally Abused:   Marland Kitchen Physically Abused:   . Sexually Abused:      Family History  Adopted: Yes     Current Outpatient Medications:  .  omeprazole (PRILOSEC) 40 MG capsule, Take 1 capsule (40 mg total) by mouth daily., Disp: 30 capsule, Rfl: 0   Physical exam: There were no vitals filed for this visit. Physical Exam Constitutional:      General: She is not in acute distress. Cardiovascular:     Rate and Rhythm: Normal rate and regular rhythm.     Heart sounds: Normal heart sounds.  Pulmonary:     Effort: Pulmonary effort is normal.     Breath sounds: Normal breath sounds.  Abdominal:     General: Bowel sounds are normal.     Palpations: Abdomen is soft.  Skin:    General: Skin is  warm and dry.  Neurological:     Mental Status: She is alert and oriented to person, place, and time.        CMP Latest Ref Rng & Units 09/26/2019  Glucose 70 - 99 mg/dL -  BUN 6 - 20 mg/dL -  Creatinine 0.44 - 1.00 mg/dL -  Sodium 135 - 145 mmol/L -  Potassium 3.5 - 5.1 mmol/L -  Chloride 98 - 111 mmol/L -  CO2 22 - 32 mmol/L -  Calcium 8.9 - 10.3 mg/dL -  Total Protein 6.0 - 8.5 g/dL 7.6  Total Bilirubin 0.0 - 1.2 mg/dL 0.5  Alkaline Phos 39 - 117 IU/L 74  AST 0 - 40 IU/L 127(H)  ALT 0 - 32 IU/L 81(H)   CBC Latest Ref Rng & Units 08/15/2019  WBC 4.0 -  10.5 K/uL 5.2  Hemoglobin 12.0 - 15.0 g/dL 11.2(L)  Hematocrit 36.0 - 46.0 % 35.3(L)  Platelets 150 - 400 K/uL 173     Assessment and plan- Patient is a 39 y.o. female referred for iron deficiency anemia  Her recent iron studies show evidence of iron deficiency given the low ferritin and low iron saturation and elevated TIBC.  Anemia is however a mild with a hemoglobin of 11.2.  Her baseline hemoglobin on 08/02/2017 was between 13-14. Etiology of iron deficiency anemia: Possibly menorrhagia versus need to rule out GI bleeding.  She sees both GI and GYN and will discuss with them about this.  From a hematologic perspective it would be reasonable to do either oral or IV iron.  Her symptoms of restless leg may also improve with IV iron.  Patient prefers IV iron.  Per her insurance I will give her Feraheme 510 mg IV weekly x2 doses.  Discussed risks and benefits of IV iron including all but not limited to headache, leg swelling and possible risk of infusion reaction.  Patient understands and agrees to proceed as planned  I will see her back in 6 weeks time and repeat CBC ferritin and iron studies B12 folate reticulocyte count and TSH   Thank you for this kind referral and the opportunity to participate in the care of this patient   Visit Diagnosis 1. Iron deficiency anemia, unspecified iron deficiency anemia type     Dr.  Randa Evens, MD, MPH [facility] [id number] 10/03/2019 2:52 PM

## 2019-10-03 NOTE — Progress Notes (Signed)
Patient scheduled today for new patient evaluation regarding anemia. Patient reports she overall feels bad, reports weakness and numbness and tingling in legs.

## 2019-10-04 ENCOUNTER — Encounter: Payer: Self-pay | Admitting: Oncology

## 2019-10-07 NOTE — Telephone Encounter (Signed)
Yes, it is in Jody's office since she is labeling things and then needing to be scanned in patient's chart.

## 2019-10-07 NOTE — Telephone Encounter (Signed)
I have called patient and have not been able to get in contact with her. I have seen medical records but they are waiting to be scanned in her chart.

## 2019-10-10 ENCOUNTER — Other Ambulatory Visit: Payer: Self-pay

## 2019-10-10 ENCOUNTER — Inpatient Hospital Stay: Payer: Managed Care, Other (non HMO)

## 2019-10-10 VITALS — BP 106/71 | HR 67 | Temp 97.6°F

## 2019-10-10 DIAGNOSIS — D509 Iron deficiency anemia, unspecified: Secondary | ICD-10-CM | POA: Diagnosis not present

## 2019-10-10 MED ORDER — SODIUM CHLORIDE 0.9 % IV SOLN
510.0000 mg | INTRAVENOUS | Status: DC
  Administered 2019-10-10: 510 mg via INTRAVENOUS
  Filled 2019-10-10: qty 510

## 2019-10-10 MED ORDER — SODIUM CHLORIDE 0.9 % IV SOLN
Freq: Once | INTRAVENOUS | Status: AC
  Filled 2019-10-10: qty 250

## 2019-10-10 NOTE — Progress Notes (Signed)
1440: pt reports since starting Prilosec, she has been experiencing Nausea, Constipation (stating it has been 3-4 days since last BM, and lower back pain. Dr. Janese Banks aware, and states to have pt follow up with GI MD who ordered Prilosec. Pt aware and verbalizes understanding.   1555: Pt tolerated infusion well. Pt monitored 30 minutes post infusion. No s/s of distress or reaction noted. Pt aware and verbalizes understanding to report to ER/call 911 in the event of an emergency such as swelling of the throat. Pt and VS stable at discharge.

## 2019-10-16 ENCOUNTER — Inpatient Hospital Stay: Payer: Managed Care, Other (non HMO) | Attending: Oncology

## 2019-11-06 ENCOUNTER — Encounter: Payer: Self-pay | Admitting: Emergency Medicine

## 2019-11-06 ENCOUNTER — Emergency Department
Admission: EM | Admit: 2019-11-06 | Discharge: 2019-11-06 | Disposition: A | Discharge status: Home / Self Care | Payer: Managed Care, Other (non HMO) | Attending: Emergency Medicine | Admitting: Emergency Medicine

## 2019-11-06 ENCOUNTER — Other Ambulatory Visit: Payer: Self-pay

## 2019-11-06 DIAGNOSIS — M545 Low back pain: Secondary | ICD-10-CM | POA: Diagnosis not present

## 2019-11-06 DIAGNOSIS — Z5321 Procedure and treatment not carried out due to patient leaving prior to being seen by health care provider: Secondary | ICD-10-CM | POA: Diagnosis not present

## 2019-11-06 DIAGNOSIS — R1030 Lower abdominal pain, unspecified: Secondary | ICD-10-CM | POA: Diagnosis present

## 2019-11-06 DIAGNOSIS — R112 Nausea with vomiting, unspecified: Secondary | ICD-10-CM | POA: Diagnosis not present

## 2019-11-06 LAB — CBC WITH DIFFERENTIAL/PLATELET
Abs Immature Granulocytes: 0.04 10*3/uL (ref 0.00–0.07)
Basophils Absolute: 0.1 10*3/uL (ref 0.0–0.1)
Basophils Relative: 1 %
Eosinophils Absolute: 0.3 10*3/uL (ref 0.0–0.5)
Eosinophils Relative: 3 %
HCT: 39.6 % (ref 36.0–46.0)
Hemoglobin: 12.8 g/dL (ref 12.0–15.0)
Immature Granulocytes: 1 %
Lymphocytes Relative: 46 %
Lymphs Abs: 3.5 10*3/uL (ref 0.7–4.0)
MCH: 27.9 pg (ref 26.0–34.0)
MCHC: 32.3 g/dL (ref 30.0–36.0)
MCV: 86.3 fL (ref 80.0–100.0)
Monocytes Absolute: 0.8 10*3/uL (ref 0.1–1.0)
Monocytes Relative: 10 %
Neutro Abs: 3 10*3/uL (ref 1.7–7.7)
Neutrophils Relative %: 39 %
Platelets: 156 10*3/uL (ref 150–400)
RBC: 4.59 MIL/uL (ref 3.87–5.11)
RDW: 20.7 % — ABNORMAL HIGH (ref 11.5–15.5)
WBC: 7.6 10*3/uL (ref 4.0–10.5)
nRBC: 0 % (ref 0.0–0.2)

## 2019-11-06 LAB — COMPREHENSIVE METABOLIC PANEL
ALT: 62 U/L — ABNORMAL HIGH (ref 0–44)
AST: 65 U/L — ABNORMAL HIGH (ref 15–41)
Albumin: 3.9 g/dL (ref 3.5–5.0)
Alkaline Phosphatase: 52 U/L (ref 38–126)
Anion gap: 9 (ref 5–15)
BUN: 9 mg/dL (ref 6–20)
CO2: 24 mmol/L (ref 22–32)
Calcium: 8.8 mg/dL — ABNORMAL LOW (ref 8.9–10.3)
Chloride: 105 mmol/L (ref 98–111)
Creatinine, Ser: 0.66 mg/dL (ref 0.44–1.00)
GFR calc Af Amer: >60 mL/min (ref >60)
GFR calc non Af Amer: >60 mL/min (ref >60)
Glucose, Bld: 78 mg/dL (ref 70–99)
Potassium: 3.8 mmol/L (ref 3.5–5.1)
Sodium: 138 mmol/L (ref 135–145)
Total Bilirubin: 0.6 mg/dL (ref 0.3–1.2)
Total Protein: 7.3 g/dL (ref 6.5–8.1)

## 2019-11-06 LAB — LIPASE, BLOOD: Lipase: 39 U/L (ref 11–51)

## 2019-11-06 NOTE — ED Triage Notes (Signed)
Patient ambulatory to triage with steady gait, without difficulty or distress noted; pt reports lower abd/back pain several days; st pain increased tonight with N/V

## 2019-11-07 ENCOUNTER — Telehealth: Payer: Self-pay

## 2019-11-07 ENCOUNTER — Inpatient Hospital Stay: Payer: Managed Care, Other (non HMO)

## 2019-11-07 ENCOUNTER — Telehealth: Payer: Self-pay | Admitting: Emergency Medicine

## 2019-11-07 NOTE — Telephone Encounter (Signed)
I have not seen her in 2 months. Would be best to contact her PCP for this issue. Please ask her to contact that office.

## 2019-11-07 NOTE — Telephone Encounter (Signed)
Called patient due to lwot to inquire about condition and follow up plans. She says she is still hurting, but not as bad.  Says she has called her doctor and is waiting for call back.  I told her to tell her doctor that her labs are done.

## 2019-11-07 NOTE — Telephone Encounter (Signed)
Pt calling; thinks she's having kidney stone issues - threw up, chills, extreme pain; went to ED but left d/t 9hr wait. CRS told her to call c any problems.  [phone].  Per ED nurse note - labs are done.

## 2019-11-07 NOTE — Telephone Encounter (Signed)
Pt aware.

## 2019-11-14 ENCOUNTER — Telehealth: Payer: Self-pay | Admitting: Oncology

## 2019-11-14 ENCOUNTER — Inpatient Hospital Stay: Payer: Managed Care, Other (non HMO)

## 2019-11-14 ENCOUNTER — Inpatient Hospital Stay: Payer: Managed Care, Other (non HMO) | Admitting: Oncology

## 2019-11-14 NOTE — Telephone Encounter (Signed)
Patient missed lab and MD appt on this date. Writer phoned patient on this date and rescheduled appts for 11-15-19 prior to poss feraheme infusion, if needed.

## 2019-11-15 ENCOUNTER — Inpatient Hospital Stay: Payer: Managed Care, Other (non HMO)

## 2019-11-15 ENCOUNTER — Inpatient Hospital Stay: Payer: Managed Care, Other (non HMO) | Attending: Oncology

## 2019-11-15 ENCOUNTER — Encounter: Payer: Self-pay | Admitting: Oncology

## 2019-11-15 ENCOUNTER — Inpatient Hospital Stay (HOSPITAL_BASED_OUTPATIENT_CLINIC_OR_DEPARTMENT_OTHER): Payer: Managed Care, Other (non HMO) | Admitting: Oncology

## 2019-11-15 ENCOUNTER — Other Ambulatory Visit: Payer: Self-pay

## 2019-11-15 VITALS — BP 117/79 | HR 69 | Temp 98.9°F | Resp 18 | Wt 193.0 lb

## 2019-11-15 DIAGNOSIS — D509 Iron deficiency anemia, unspecified: Secondary | ICD-10-CM | POA: Insufficient documentation

## 2019-11-15 DIAGNOSIS — F17210 Nicotine dependence, cigarettes, uncomplicated: Secondary | ICD-10-CM | POA: Diagnosis not present

## 2019-11-15 DIAGNOSIS — Z79899 Other long term (current) drug therapy: Secondary | ICD-10-CM | POA: Insufficient documentation

## 2019-11-15 LAB — CBC WITH DIFFERENTIAL/PLATELET
Abs Immature Granulocytes: 0.02 10*3/uL (ref 0.00–0.07)
Basophils Absolute: 0.1 10*3/uL (ref 0.0–0.1)
Basophils Relative: 1 %
Eosinophils Absolute: 0.3 10*3/uL (ref 0.0–0.5)
Eosinophils Relative: 4 %
HCT: 40.1 % (ref 36.0–46.0)
Hemoglobin: 13.5 g/dL (ref 12.0–15.0)
Immature Granulocytes: 0 %
Lymphocytes Relative: 36 %
Lymphs Abs: 2.3 10*3/uL (ref 0.7–4.0)
MCH: 28.1 pg (ref 26.0–34.0)
MCHC: 33.7 g/dL (ref 30.0–36.0)
MCV: 83.5 fL (ref 80.0–100.0)
Monocytes Absolute: 0.5 10*3/uL (ref 0.1–1.0)
Monocytes Relative: 7 %
Neutro Abs: 3.4 10*3/uL (ref 1.7–7.7)
Neutrophils Relative %: 52 %
Platelets: 146 10*3/uL — ABNORMAL LOW (ref 150–400)
RBC: 4.8 MIL/uL (ref 3.87–5.11)
RDW: 20 % — ABNORMAL HIGH (ref 11.5–15.5)
WBC: 6.5 10*3/uL (ref 4.0–10.5)
nRBC: 0 % (ref 0.0–0.2)

## 2019-11-15 LAB — IRON AND TIBC
Iron: 45 ug/dL (ref 28–170)
Saturation Ratios: 11 % (ref 10.4–31.8)
TIBC: 396 ug/dL (ref 250–450)
UIBC: 351 ug/dL

## 2019-11-15 LAB — FERRITIN: Ferritin: 24 ng/mL (ref 11–307)

## 2019-11-15 LAB — VITAMIN B12: Vitamin B-12: 210 pg/mL (ref 180–914)

## 2019-11-15 LAB — TSH: TSH: 1.699 u[IU]/mL (ref 0.350–4.500)

## 2019-11-15 LAB — FOLATE: Folate: 5.7 ng/mL — ABNORMAL LOW (ref >5.9)

## 2019-11-15 NOTE — Progress Notes (Signed)
Hematology/Oncology Consult note [facility]  [phone] [phone]  Patient Care Team: [place] Pink, MD as PCP - General (Family Medicine)   Name of the patient: Abigail Cunningham  [id number]  05-Aug-1980   Date of visit: 11/15/19  Diagnosis-iron deficiency anemia  Chief complaint/ Reason for visit-routine follow-up of iron deficiency anemia  Heme/Onc history: Patient is a 39 year old female with history of autoimmune hepatitis who was recently seen by Dr. Bonna Gains on 08/14/2019 as a transfer of care for autoimmune hepatitis.  As a part of the work-up she had CBC checked which showed mild anemia with an H&H of 11.2/35.3.  White count was normal at 5.2 and platelets were normal at 173.  Iron study showed a low iron saturation of 7% and elevated TIBC of 471.  Ferritin levels were low at 21.  She tested negative for hepatitis A, B, and C.  Patient also has a history of ovarian mass and was seen by GYN CT renal stone study in March 2021 had shown a 12.9 x 19 x 21.3 cm left adnexal cystic mass for which patient underwent left salpingo-oophorectomy.  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  Interval history-patient reports chronic exertional shortness of breath which she says has been going on for the last 2 months or so.  Menstrual cycles last for 5 days but they continue to be heavy.  She is seeing GI for possible autoimmune hepatitis.  Reports having occasional rash over her left foot which comes and goes  ECOG PS- 1 Pain scale- 0   Review of systems- Review of Systems  Constitutional: Positive for malaise/fatigue. Negative for chills, fever and weight loss.  HENT: Negative for congestion, ear discharge and nosebleeds.   Eyes: Negative for blurred vision.  Respiratory: Positive for shortness of breath. Negative for cough, hemoptysis, sputum production and wheezing.   Cardiovascular: Negative for chest pain,  palpitations, orthopnea and claudication.  Gastrointestinal: Negative for abdominal pain, blood in stool, constipation, diarrhea, heartburn, melena, nausea and vomiting.  Genitourinary: Negative for dysuria, flank pain, frequency, hematuria and urgency.  Musculoskeletal: Negative for back pain, joint pain and myalgias.  Skin: Negative for rash.  Neurological: Negative for dizziness, tingling, focal weakness, seizures, weakness and headaches.  Endo/Heme/Allergies: Does not bruise/bleed easily.  Psychiatric/Behavioral: Negative for depression and suicidal ideas. The patient does not have insomnia.        Allergies  Allergen Reactions  . Haldol [Haloperidol Lactate] Other (See Comments)    Shuts down 'system'     Past Medical History:  Diagnosis Date  . Anemia   . Asthma   . Borderline personality disorder ([place])   . Bronchitis unk  . Chronic hepatitis ([place])   . Generalized OA   . Immune deficiency disorder ([place])    chronic autoimmune hepatitis per pt  . Kidney stone   . Migraine      Past Surgical History:  Procedure Laterality Date  . ABDOMINAL HYSTERECTOMY    . LAPAROSCOPIC UNILATERAL SALPINGO OOPHERECTOMY Left 08/15/2019   Procedure: LAPAROSCOPIC UNILATERAL SALPINGO OOPHORECTOMY;  Surgeon: Homero Fellers, MD;  Location: [facility] ORS;  Service: Gynecology;  Laterality: Left;  . LITHOTRIPSY    . OVARIAN CYST REMOVAL      Social History   Socioeconomic History  . Marital status: Married    Spouse name: Not on file  . Number of children: Not on file  . Years of education: Not on file  . Highest education level:  Not on file  Occupational History  . Not on file  Tobacco Use  . Smoking status: Current Some Day Smoker    Packs/day: 1.00    Types: Cigarettes  . Smokeless tobacco: Never Used  . Tobacco comment: 1 cigarette per day   Substance and Sexual Activity  . Alcohol use: No  . Drug use: Never  . Sexual activity: Not Currently  Other Topics Concern  . Not  on file  Social History Narrative  . Not on file   Social Determinants of Health   Financial Resource Strain:   . Difficulty of Paying Living Expenses:   Food Insecurity:   . Worried About Charity fundraiser in the Last Year:   . Arboriculturist in the Last Year:   Transportation Needs:   . Film/video editor (Medical):   Marland Kitchen Lack of Transportation (Non-Medical):   Physical Activity:   . Days of Exercise per Week:   . Minutes of Exercise per Session:   Stress:   . Feeling of Stress :   Social Connections:   . Frequency of Communication with Friends and Family:   . Frequency of Social Gatherings with Friends and Family:   . Attends Religious Services:   . Active Member of Clubs or Organizations:   . Attends Archivist Meetings:   Marland Kitchen Marital Status:   Intimate Partner Violence:   . Fear of Current or Ex-Partner:   . Emotionally Abused:   Marland Kitchen Physically Abused:   . Sexually Abused:     Family History  Adopted: Yes     Current Outpatient Medications:  .  albuterol (VENTOLIN HFA) 108 (90 Base) MCG/ACT inhaler, Inhale into the lungs every 6 (six) hours as needed for wheezing or shortness of breath., Disp: , Rfl:  .  ibuprofen (ADVIL) 200 MG tablet, Take 200 mg by mouth every 6 (six) hours as needed., Disp: , Rfl:  .  omeprazole (PRILOSEC) 40 MG capsule, Take 1 capsule (40 mg total) by mouth daily. (Patient not taking: Reported on 11/15/2019), Disp: 30 capsule, Rfl: 0  Physical exam:  Vitals:   11/15/19 1030  BP: 117/79  Pulse: 69  Resp: 18  Temp: 98.9 F (37.2 C)  TempSrc: Oral  SpO2: 100%  Weight: 193 lb (87.5 kg)   Physical Exam Cardiovascular:     Rate and Rhythm: Normal rate and regular rhythm.     Heart sounds: Normal heart sounds.  Pulmonary:     Effort: Pulmonary effort is normal.     Breath sounds: Normal breath sounds.  Abdominal:     General: Bowel sounds are normal.     Palpations: Abdomen is soft.  Skin:    General: Skin is warm and dry.   Neurological:     Mental Status: She is alert and oriented to person, place, and time.      CMP Latest Ref Rng & Units 11/06/2019  Glucose 70 - 99 mg/dL 78  BUN 6 - 20 mg/dL 9  Creatinine 0.44 - 1.00 mg/dL 0.66  Sodium 135 - 145 mmol/L 138  Potassium 3.5 - 5.1 mmol/L 3.8  Chloride 98 - 111 mmol/L 105  CO2 22 - 32 mmol/L 24  Calcium 8.9 - 10.3 mg/dL 8.8(L)  Total Protein 6.5 - 8.1 g/dL 7.3  Total Bilirubin 0.3 - 1.2 mg/dL 0.6  Alkaline Phos 38 - 126 U/L 52  AST 15 - 41 U/L 65(H)  ALT 0 - 44 U/L 62(H)   CBC Latest  Ref Rng & Units 11/15/2019  WBC 4.0 - 10.5 K/uL 6.5  Hemoglobin 12.0 - 15.0 g/dL 13.5  Hematocrit 36 - 46 % 40.1  Platelets 150 - 400 K/uL 146(L)     Assessment and plan- Patient is a 39 y.o. female who is here for follow-up of iron deficiency anemia  Patient's hemoglobin is improved from 11-13.5.  Iron studies B12 levels are currently pending.  I do not think that she needs IV iron at this time.  I will repeat CBC ferritin and iron studies in 3 in 6 months and see her back in 6 months.  With regards to etiology of iron deficiency anemia: She is likely to undergo endoscopy with GI.  Have also encouraged her to keep up her appointments with GYN to discuss her menorrhagia.  Patient is complaining of shortness of breath today.  She does not appear short of breath at rest.  No signs and symptoms of infection.  Lung sounds normal.  Reported her symptoms have been ongoing for the last 2 months.  I have encouraged her to speak to her primary care doctor about this   Visit Diagnosis 1. Iron deficiency anemia, unspecified iron deficiency anemia type      Dr. Randa Evens, MD, MPH [facility] [phone] 11/15/2019 12:17 PM

## 2019-11-15 NOTE — Progress Notes (Signed)
Patient here for oncology follow-up appointment, expresses complaints of chills, pain with urination and shortness of breath. Patient states she thinks she has a UTI or stones.

## 2019-11-19 ENCOUNTER — Telehealth: Payer: Self-pay

## 2019-11-19 NOTE — Telephone Encounter (Signed)
Spoke with patient and gave her the recommendations. SJC

## 2019-11-19 NOTE — Telephone Encounter (Signed)
-----   Message from Sindy Guadeloupe, MD sent at 11/18/2019  9:59 AM EDT ----- Please ask her to take po b12 1000 mc daily. I am sending prescription for po folic acid

## 2019-11-20 ENCOUNTER — Other Ambulatory Visit: Payer: Self-pay

## 2019-11-20 MED ORDER — FOLIC ACID 1 MG PO TABS
1.0000 mg | ORAL_TABLET | Freq: Every day | ORAL | 1 refills | Status: DC
Start: 2019-11-20 — End: 2021-03-08

## 2019-11-21 ENCOUNTER — Other Ambulatory Visit: Payer: Self-pay

## 2019-11-21 ENCOUNTER — Ambulatory Visit (INDEPENDENT_AMBULATORY_CARE_PROVIDER_SITE_OTHER): Payer: Managed Care, Other (non HMO) | Admitting: Gastroenterology

## 2019-11-21 ENCOUNTER — Encounter: Payer: Self-pay | Admitting: Gastroenterology

## 2019-11-21 VITALS — BP 104/71 | HR 81 | Temp 98.2°F | Ht 63.0 in | Wt 194.8 lb

## 2019-11-21 DIAGNOSIS — D509 Iron deficiency anemia, unspecified: Secondary | ICD-10-CM

## 2019-11-21 DIAGNOSIS — R748 Abnormal levels of other serum enzymes: Secondary | ICD-10-CM

## 2019-11-21 DIAGNOSIS — K754 Autoimmune hepatitis: Secondary | ICD-10-CM

## 2019-11-21 MED ORDER — NA SULFATE-K SULFATE-MG SULF 17.5-3.13-1.6 GM/177ML PO SOLN: ORAL | 0 refills | Status: DC

## 2019-11-21 MED ORDER — CLENPIQ 10-3.5-12 MG-GM -GM/160ML PO SOLN
ORAL | 0 refills | Status: DC
Start: 2019-11-21 — End: 2021-03-08

## 2019-11-21 MED ORDER — SUTAB 1479-225-188 MG PO TABS
ORAL_TABLET | ORAL | 0 refills | Status: DC
Start: 2019-11-21 — End: 2021-03-08

## 2019-11-21 NOTE — Patient Instructions (Signed)
We will call you to let you know when your liver biopsy will be scheduled.

## 2019-11-21 NOTE — Progress Notes (Signed)
Abigail Antigua, MD [address]  [place], [place] [address]  Main: [phone]  Fax: [phone]   Primary Care Physician: Abigail Pink, MD   Chief Complaint  Patient presents with  . Follow-up    Autoimmune hepatitis    HPI: Abigail Cunningham is a 39 y.o. female here for follow-up of autoimmune hepatitis and iron deficiency anemia.  We have tried on multiple occasions to obtain her previous autoimmune hepatitis records and liver biopsy records from [facility] and [place].  Reportedly patient had a liver biopsy done in 1999.  We have been unable to get these records despite multiple attempts.  She has not been on any medications for autoimmune hepatitis in years.  Current Outpatient Medications  Medication Sig Dispense Refill  . albuterol (VENTOLIN HFA) 108 (90 Base) MCG/ACT inhaler Inhale into the lungs every 6 (six) hours as needed for wheezing or shortness of breath.    . folic acid (FOLVITE) 1 MG tablet Take 1 tablet (1 mg total) by mouth daily. 30 tablet 1  . ibuprofen (ADVIL) 200 MG tablet Take 200 mg by mouth every 6 (six) hours as needed.    . Na Sulfate-K Sulfate-Mg Sulf 17.5-3.13-1.6 GM/177ML SOLN At 5 PM the day before procedure take 1 bottle and 5 hours before procedure take 1 bottle. 354 mL 0  . omeprazole (PRILOSEC) 40 MG capsule Take 1 capsule (40 mg total) by mouth daily. (Patient not taking: Reported on 11/15/2019) 30 capsule 0   No current facility-administered medications for this visit.    Allergies as of 11/21/2019 - Review Complete 11/21/2019  Allergen Reaction Noted  . Haldol [haloperidol lactate] Other (See Comments) 10/23/2014    ROS:  General: Negative for anorexia, weight loss, fever, chills, fatigue, weakness. ENT: Negative for hoarseness, difficulty swallowing , nasal congestion. CV: Negative for chest pain, angina, palpitations, dyspnea on exertion, peripheral edema.  Respiratory: Negative for dyspnea at rest, dyspnea on  exertion, cough, sputum, wheezing.  GI: See history of present illness. GU:  Negative for dysuria, hematuria, urinary incontinence, urinary frequency, nocturnal urination.  Endo: Negative for unusual weight change.    Physical Examination:   BP 104/71   Pulse 81   Temp 98.2 F (36.8 C) (Oral)   Ht 5\' 3"  (1.6 m)   Wt 194 lb 12.8 oz (88.4 kg)   LMP 10/29/2019 (Exact Date)   BMI 34.51 kg/m   General: Well-nourished, well-developed in no acute distress.  Eyes: No icterus. Conjunctivae Cunningham. Mouth: Oropharyngeal mucosa moist and Cunningham , no lesions erythema or exudate. Neck: Supple, Trachea midline Abdomen: Bowel sounds are normal, nontender, nondistended, no hepatosplenomegaly or masses, no abdominal bruits or hernia , no rebound or guarding.   Extremities: No lower extremity edema. No clubbing or deformities. Neuro: Alert and oriented x 3.  Grossly intact. Skin: Warm and dry, no jaundice.   Psych: Alert and cooperative, normal mood and affect.   Labs: CMP     Component Value Date/Time   NA 138 11/06/2019 2259   NA 137 01/07/2013 0112   K 3.8 11/06/2019 2259   K 3.6 01/07/2013 0112   CL 105 11/06/2019 2259   CL 107 01/07/2013 0112   CO2 24 11/06/2019 2259   CO2 24 01/07/2013 0112   GLUCOSE 78 11/06/2019 2259   GLUCOSE 94 01/07/2013 0112   BUN 9 11/06/2019 2259   BUN 9 01/07/2013 0112   CREATININE 0.66 11/06/2019 2259   CREATININE 0.71 01/07/2013 0112   CALCIUM  8.8 (L) 11/06/2019 2259   CALCIUM 8.9 01/07/2013 0112   PROT 7.3 11/06/2019 2259   PROT 7.6 09/26/2019 1529   PROT 7.9 08/19/2012 1457   ALBUMIN 3.9 11/06/2019 2259   ALBUMIN 4.2 09/26/2019 1529   ALBUMIN 4.1 08/19/2012 1457   AST 65 (H) 11/06/2019 2259   AST 75 (H) 08/19/2012 1457   ALT 62 (H) 11/06/2019 2259   ALT 101 (H) 08/19/2012 1457   ALKPHOS 52 11/06/2019 2259   ALKPHOS 70 08/19/2012 1457   BILITOT 0.6 11/06/2019 2259   BILITOT 0.5 09/26/2019 1529   BILITOT 0.3 08/19/2012 1457   GFRNONAA >60  11/06/2019 2259   GFRNONAA >60 01/07/2013 0112   GFRAA >60 11/06/2019 2259   GFRAA >60 01/07/2013 0112   Lab Results  Component Value Date   WBC 6.5 11/15/2019   HGB 13.5 11/15/2019   HCT 40.1 11/15/2019   MCV 83.5 11/15/2019   PLT 146 (L) 11/15/2019    Imaging Studies: No results found.  Assessment and Plan:   Abigail Cunningham is a 39 y.o. y/o female was reported history of autoimmune hepatitis, and iron deficiency anemia here for follow-up  Liver biopsy indicated at this time given previous history of autoimmune hepatitis, and elevated liver enzymes with other work-up being inconclusive, and no medication therapy for years.  Unable to obtain previous records despite multiple attempts as these were old records from 17s  Patient has seen Dr. Janese Banks for iron deficiency anemia  EGD and colonoscopy indicated for iron deficiency anemia  I have discussed alternative options, risks & benefits,  which include, but are not limited to, bleeding, infection, perforation,respiratory complication & drug reaction.  The patient agrees with this plan & written consent will be obtained.     Dr Abigail Cunningham

## 2019-11-25 ENCOUNTER — Telehealth: Payer: Self-pay

## 2019-11-25 NOTE — Telephone Encounter (Signed)
Called patient to let her know that her liver [facility] Patient agreed and had no further questions.

## 2019-11-25 NOTE — Telephone Encounter (Signed)
I will check patient's biopsy's result 10 days later to make sure we have it and then let Dr. Bonna Gains to review and tell me if she needs to see the patient or not.

## 2019-11-26 ENCOUNTER — Other Ambulatory Visit: Payer: Self-pay | Admitting: Radiology

## 2019-11-26 NOTE — Progress Notes (Signed)
Patient on schedule for Liver Biopsy 11/28/2019, called and spoke with patient. Pre procedure instructions given, made aware to be here @ 0930, NPO after Mn prior to procedure, and have driver for discharge after recovery/procedure.stated understanding.

## 2019-11-28 ENCOUNTER — Other Ambulatory Visit: Payer: Self-pay

## 2019-11-28 ENCOUNTER — Ambulatory Visit
Admission: RE | Admit: 2019-11-28 | Discharge: 2019-11-28 | Disposition: A | Discharge status: Home / Self Care | Payer: Managed Care, Other (non HMO) | Source: Ambulatory Visit | Attending: Gastroenterology | Admitting: Gastroenterology

## 2019-11-28 DIAGNOSIS — F603 Borderline personality disorder: Secondary | ICD-10-CM | POA: Insufficient documentation

## 2019-11-28 DIAGNOSIS — F17210 Nicotine dependence, cigarettes, uncomplicated: Secondary | ICD-10-CM | POA: Insufficient documentation

## 2019-11-28 DIAGNOSIS — J45909 Unspecified asthma, uncomplicated: Secondary | ICD-10-CM | POA: Insufficient documentation

## 2019-11-28 DIAGNOSIS — Z90721 Acquired absence of ovaries, unilateral: Secondary | ICD-10-CM | POA: Insufficient documentation

## 2019-11-28 DIAGNOSIS — R748 Abnormal levels of other serum enzymes: Secondary | ICD-10-CM

## 2019-11-28 DIAGNOSIS — M159 Polyosteoarthritis, unspecified: Secondary | ICD-10-CM | POA: Insufficient documentation

## 2019-11-28 DIAGNOSIS — Z90710 Acquired absence of both cervix and uterus: Secondary | ICD-10-CM | POA: Diagnosis not present

## 2019-11-28 DIAGNOSIS — K754 Autoimmune hepatitis: Secondary | ICD-10-CM | POA: Diagnosis not present

## 2019-11-28 DIAGNOSIS — Z888 Allergy status to other drugs, medicaments and biological substances status: Secondary | ICD-10-CM | POA: Insufficient documentation

## 2019-11-28 DIAGNOSIS — D509 Iron deficiency anemia, unspecified: Secondary | ICD-10-CM | POA: Insufficient documentation

## 2019-11-28 DIAGNOSIS — M545 Low back pain: Secondary | ICD-10-CM | POA: Insufficient documentation

## 2019-11-28 DIAGNOSIS — Z87442 Personal history of urinary calculi: Secondary | ICD-10-CM | POA: Insufficient documentation

## 2019-11-28 DIAGNOSIS — Z79899 Other long term (current) drug therapy: Secondary | ICD-10-CM | POA: Diagnosis not present

## 2019-11-28 LAB — CBC
HCT: 40.1 % (ref 36.0–46.0)
Hemoglobin: 14 g/dL (ref 12.0–15.0)
MCH: 28.9 pg (ref 26.0–34.0)
MCHC: 34.9 g/dL (ref 30.0–36.0)
MCV: 82.7 fL (ref 80.0–100.0)
Platelets: 168 10*3/uL (ref 150–400)
RBC: 4.85 MIL/uL (ref 3.87–5.11)
RDW: 18.7 % — ABNORMAL HIGH (ref 11.5–15.5)
WBC: 6.4 10*3/uL (ref 4.0–10.5)
nRBC: 0 % (ref 0.0–0.2)

## 2019-11-28 LAB — PROTIME-INR
INR: 1 (ref 0.8–1.2)
Prothrombin Time: 12.4 seconds (ref 11.4–15.2)

## 2019-11-28 MED ORDER — MIDAZOLAM HCL 5 MG/5ML IJ SOLN
INTRAMUSCULAR | Status: AC
  Filled 2019-11-28: qty 5

## 2019-11-28 MED ORDER — FENTANYL CITRATE (PF) 100 MCG/2ML IJ SOLN
INTRAMUSCULAR | Status: AC
  Filled 2019-11-28: qty 2

## 2019-11-28 MED ORDER — OXYCODONE HCL 5 MG PO TABS: 5.0000 mg | ORAL_TABLET | ORAL | Status: DC | PRN

## 2019-11-28 MED ORDER — SODIUM CHLORIDE 0.9 % IV SOLN: INTRAVENOUS | Status: DC

## 2019-11-28 MED ORDER — MIDAZOLAM HCL 5 MG/5ML IJ SOLN
INTRAMUSCULAR | Status: AC | PRN
  Administered 2019-11-28: 1 mg via INTRAVENOUS

## 2019-11-28 MED ORDER — FENTANYL CITRATE (PF) 100 MCG/2ML IJ SOLN
INTRAMUSCULAR | Status: AC | PRN
  Administered 2019-11-28: 25 ug via INTRAVENOUS
  Administered 2019-11-28: 50 ug via INTRAVENOUS

## 2019-11-28 NOTE — Consult Note (Signed)
Chief Complaint: Patient was seen in consultation today for ultrasound-guided random liver biopsy at the request of [place] B  Referring Physician(s): Tahiliani,Varnita B   Patient Status: [facility] - Out-pt  History of Present Illness: Abigail Cunningham is a 39 y.o. female with history of autoimmune hepatitis and iron deficiency anemia.  Reportedly, patient had a liver biopsy in the 1990s.  Patient has elevated liver enzymes and request for a repeat liver biopsy.  Patient has no acute concerns or complaints.  She reports intermittent lower back pain that she attributes to kidney stones.  Currently, she is not having back pain, fevers or chills.  She denies shortness of breath, chest pain or abdominal symptoms.  No dysuria, hematuria or bowel issues at this time.  Past Medical History:  Diagnosis Date  . Anemia   . Asthma   . Borderline personality disorder ([place])   . Bronchitis unk  . Chronic hepatitis ([place])   . Generalized OA   . Immune deficiency disorder ([place])    chronic autoimmune hepatitis per pt  . Kidney stone   . Migraine     Past Surgical History:  Procedure Laterality Date  . ABDOMINAL HYSTERECTOMY    . LAPAROSCOPIC UNILATERAL SALPINGO OOPHERECTOMY Left 08/15/2019   Procedure: LAPAROSCOPIC UNILATERAL SALPINGO OOPHORECTOMY;  Surgeon: Homero Fellers, MD;  Location: [facility] ORS;  Service: Gynecology;  Laterality: Left;  . LITHOTRIPSY    . OVARIAN CYST REMOVAL      Allergies: Haldol [haloperidol lactate]  Medications: Prior to Admission medications   Medication Sig Start Date End Date Taking? Authorizing Provider  ibuprofen (ADVIL) 200 MG tablet Take 200 mg by mouth every 6 (six) hours as needed.   Yes [provider]  albuterol (VENTOLIN HFA) 108 (90 Base) MCG/ACT inhaler Inhale into the lungs every 6 (six) hours as needed for wheezing or shortness of breath.    [provider]  folic acid (FOLVITE) 1 MG tablet Take 1 tablet (1 mg  total) by mouth daily. 11/20/19   Sindy Guadeloupe, MD  Na Sulfate-K Sulfate-Mg Sulf 17.5-3.13-1.6 GM/177ML SOLN At 5 PM the day before procedure take 1 bottle and 5 hours before procedure take 1 bottle. 11/21/19   Virgel Manifold, MD  omeprazole (PRILOSEC) 40 MG capsule Take 1 capsule (40 mg total) by mouth daily. Patient not taking: Reported on 11/15/2019 09/26/19   Virgel Manifold, MD  Sod Picosulfate-Mag Ox-Cit Acd (CLENPIQ) 10-3.5-12 MG-GM -GM/160ML SOLN Take 1 bottle at 5 PM followed by five 8 oz cups of water and repeat 5 hours before procedure. 11/21/19   Virgel Manifold, MD  Sodium Sulfate-Mag Sulfate-KCl (SUTAB) [id number] MG TABS At 5 PM take 12 tablets using the 8 oz cup provided in the kit drinking 5 cups of water and 5 hours before your procedure repeat the same process. 11/21/19   Virgel Manifold, MD     Family History  Adopted: Yes    Social History   Socioeconomic History  . Marital status: Married    Spouse name: Not on file  . Number of children: Not on file  . Years of education: Not on file  . Highest education level: Not on file  Occupational History  . Not on file  Tobacco Use  . Smoking status: Current Some Day Smoker    Packs/day: 1.00    Types: Cigarettes  . Smokeless tobacco: Never Used  . Tobacco comment: 1 cigarette per day   Vaping Use  . Vaping Use:  Never used  Substance and Sexual Activity  . Alcohol use: No  . Drug use: Never  . Sexual activity: Not Currently  Other Topics Concern  . Not on file  Social History Narrative  . Not on file   Social Determinants of Health   Financial Resource Strain:   . Difficulty of Paying Living Expenses:   Food Insecurity:   . Worried About Charity fundraiser in the Last Year:   . Arboriculturist in the Last Year:   Transportation Needs:   . Film/video editor (Medical):   Marland Kitchen Lack of Transportation (Non-Medical):   Physical Activity:   . Days of Exercise per Week:   . Minutes of Exercise  per Session:   Stress:   . Feeling of Stress :   Social Connections:   . Frequency of Communication with Friends and Family:   . Frequency of Social Gatherings with Friends and Family:   . Attends Religious Services:   . Active Member of Clubs or Organizations:   . Attends Archivist Meetings:   Marland Kitchen Marital Status:      Review of Systems  Constitutional: Negative.   Respiratory: Negative.   Cardiovascular: Negative.   Gastrointestinal: Negative.   Genitourinary: Negative.     Vital Signs: BP (!) 100/58   Pulse 62   Temp 98.6 F (37 C) (Oral)   Resp 14   Ht 5' 3"  (1.6 m)   Wt 89.8 kg   LMP 11/26/2019 Comment: patient states there is no way she could be pregnant  SpO2 97%   BMI 35.07 kg/m   Physical Exam Cardiovascular:     Rate and Rhythm: Normal rate and regular rhythm.  Pulmonary:     Effort: Pulmonary effort is normal.     Breath sounds: Normal breath sounds.  Abdominal:     General: Abdomen is flat. Bowel sounds are normal.     Palpations: Abdomen is soft.  Neurological:     Mental Status: She is alert.     Imaging: No results found.  Labs:  CBC: Recent Labs    08/15/19 1016 11/06/19 2259 11/15/19 1002 11/28/19 0935  WBC 5.2 7.6 6.5 6.4  HGB 11.2* 12.8 13.5 14.0  HCT 35.3* 39.6 40.1 40.1  PLT 173 156 146* 168    COAGS: Recent Labs    11/28/19 0935  INR 1.0    BMP: Recent Labs    08/06/19 0646 08/15/19 1016 11/06/19 2259  NA 138 139 138  K 4.0 3.9 3.8  CL 110 107 105  CO2 20* 24 24  GLUCOSE 166* 112* 78  BUN 11 6 9   CALCIUM 9.1 8.8* 8.8*  CREATININE 0.78 0.70 0.66  GFRNONAA >60 >60 >60  GFRAA >60 >60 >60    LIVER FUNCTION TESTS: Recent Labs    08/06/19 0646 08/15/19 1016 09/26/19 1529 11/06/19 2259  BILITOT 0.5 0.7 0.5 0.6  AST 49* 49* 127* 65*  ALT 45* 44 81* 62*  ALKPHOS 58 58 74 52  PROT 7.1 7.5 7.6 7.3  ALBUMIN 3.8 3.9 4.2 3.9    TUMOR MARKERS: No results for input(s): AFPTM, CEA, CA199, CHROMGRNA  in the last 8760 hours.  Assessment and Plan:  39 year old with history of autoimmune hepatitis and elevated liver enzymes.  Request for liver biopsy.  Plan for ultrasound-guided liver biopsy with moderate sedation.  Risks and benefits of random liver biopsy was discussed with the patient and/or patient's family including, but not limited to bleeding,  infection, damage to adjacent structures or low yield requiring additional tests.  All of the questions were answered and there is agreement to proceed.  Consent signed and in chart.    Thank you for this interesting consult.  I greatly enjoyed meeting Abigail Cunningham and look forward to participating in their care.  A copy of this report was sent to the requesting provider on this date.  Electronically Signed: Burman Riis, MD 11/28/2019, 10:41 AM   I spent a total of  15 Minutes   in face to face in clinical consultation, greater than 50% of which was counseling/coordinating care for liver biopsy.

## 2019-11-28 NOTE — Sedation Documentation (Signed)
Total conscious sedation: Versed 1 mg, Fentanyl 75 mcg IV. Pt. Tolerated procedure well.

## 2019-11-28 NOTE — Procedures (Signed)
Interventional Radiology Procedure:   Indications: Elevated liver enzymes  Procedure: US guided liver biopsy  Findings: Echogenic liver.  3 cores from right hepatic lobe.  Gelfoam injected along biopsy needle tract.  Complications: None     EBL: less than 20 ml  Plan: Bedrest 3 hours   Christyan Reger R. Anselm Pancoast, MD  Pager: [phone]

## 2019-11-28 NOTE — Discharge Instructions (Signed)
Moderate Conscious Sedation, Adult, Care After °These instructions provide you with information about caring for yourself after your procedure. Your health care provider may also give you more specific instructions. Your treatment has been planned according to current medical practices, but problems sometimes occur. Call your health care provider if you have any problems or questions after your procedure. °What can I expect after the procedure? °After your procedure, it is common: °· To feel sleepy for several hours. °· To feel clumsy and have poor balance for several hours. °· To have poor judgment for several hours. °· To vomit if you eat too soon. °Follow these instructions at home: °For at least 24 hours after the procedure: ° °· Do not: °? Participate in activities where you could fall or become injured. °? Drive. °? Use heavy machinery. °? Drink alcohol. °? Take sleeping pills or medicines that cause drowsiness. °? Make important decisions or sign legal documents. °? Take care of children on your own. °· Rest. °Eating and drinking °· Follow the diet recommended by your health care provider. °· If you vomit: °? Drink water, juice, or soup when you can drink without vomiting. °? Make sure you have little or no nausea before eating solid foods. °General instructions °· Have a responsible adult stay with you until you are awake and alert. °· Take over-the-counter and prescription medicines only as told by your health care provider. °· If you smoke, do not smoke without supervision. °· Keep all follow-up visits as told by your health care provider. This is important. °Contact a health care provider if: °· You keep feeling nauseous or you keep vomiting. °· You feel light-headed. °· You develop a rash. °· You have a fever. °Get help right away if: °· You have trouble breathing. °This information is not intended to replace advice given to you by your health care provider. Make sure you discuss any questions you have  with your health care provider. °Document Revised: 04/14/2017 Document Reviewed: 08/22/2015 °Elsevier Patient Education © 2020 [employer] °Liver Biopsy, Care After °These instructions give you information on caring for yourself after your procedure. Your doctor may also give you more specific instructions. Call your doctor if you have any problems or questions after your procedure. °What can I expect after the procedure? °After the procedure, it is common to have: °· Pain and soreness where the biopsy was done. °· Bruising around the area where the biopsy was done. °· Sleepiness and be tired for a few days. °Follow these instructions at home: °Medicines °· Take over-the-counter and prescription medicines only as told by your doctor. °· If you were prescribed an antibiotic medicine, take it as told by your doctor. Do not stop taking the antibiotic even if you start to feel better. °· Do not take medicines such as aspirin and ibuprofen. These medicines can thin your blood. Do not take these medicines unless your doctor tells you to take them. °· If you are taking prescription pain medicine, take actions to prevent or treat constipation. Your doctor may recommend that you: °? Drink enough fluid to keep your pee (urine) clear or pale yellow. °? Take over-the-counter or prescription medicines. °? Eat foods that are high in fiber, such as fresh fruits and vegetables, whole grains, and beans. °? Limit foods that are high in fat and processed sugars, such as fried and sweet foods. °Caring for your cut °· Follow instructions from your doctor about how to take care of your cuts from surgery (incisions).   Make sure you: °? Wash your hands with soap and water before you change your bandage (dressing). If you cannot use soap and water, use hand sanitizer. °? Change your bandage as told by your doctor. °? Leave stitches (sutures), skin glue, or skin tape (adhesive) strips in place. They may need to stay in place for 2 weeks or  longer. If tape strips get loose and curl up, you may trim the loose edges. Do not remove tape strips completely unless your doctor says it is okay. °· Check your cuts every day for signs of infection. Check for: °? Redness, swelling, or more pain. °? Fluid or blood. °? Pus or a bad smell. °? Warmth. °· Do not take baths, swim, or use a hot tub until your doctor says it is okay to do so. °Activity ° °· Rest at home for 1-2 days or as told by your doctor. °? Avoid sitting for a long time without moving. Get up to take short walks every 1-2 hours. °· Return to your normal activities as told by your doctor. Ask what activities are safe for you. °· Do not do these things in the first 24 hours: °? Drive. °? Use machinery. °? Take a bath or shower. °· Do not lift more than 10 pounds (4.5 kg) or play contact sports for the first 2 weeks. °General instructions ° °· Do not drink alcohol in the first week after the procedure. °· Have someone stay with you for at least 24 hours after the procedure. °· Get your test results. Ask your doctor or the department that is doing the test: °? When will my results be ready? °? How will I get my results? °? What are my treatment options? °? What other tests do I need? °? What are my next steps? °· Keep all follow-up visits as told by your doctor. This is important. °Contact a doctor if: °· A cut bleeds and leaves more than just a small spot of blood. °· A cut is red, puffs up (swells), or hurts more than before. °· Fluid or something else comes from a cut. °· A cut smells bad. °· You have a fever or chills. °Get help right away if: °· You have swelling, bloating, or pain in your belly (abdomen). °· You get dizzy or faint. °· You have a rash. °· You feel sick to your stomach (nauseous) or throw up (vomit). °· You have trouble breathing, feel short of breath, or feel faint. °· Your chest hurts. °· You have problems talking or seeing. °· You have trouble with your balance or moving your  arms or legs. °Summary °· After the procedure, it is common to have pain, soreness, bruising, and tiredness. °· Your doctor will tell you how to take care of yourself at home. Change your bandage, take your medicines, and limit your activities as told by your doctor. °· Call your doctor if you have symptoms of infection. Get help right away if your belly swells, your cut bleeds a lot, or you have trouble talking or breathing. °This information is not intended to replace advice given to you by your health care provider. Make sure you discuss any questions you have with your health care provider. °Document Revised: 05/12/2017 Document Reviewed: 05/12/2017 °Elsevier Patient Education © 2020 Elsevier Inc. ° °

## 2019-11-29 ENCOUNTER — Encounter: Payer: Self-pay | Admitting: Oncology

## 2019-11-29 ENCOUNTER — Other Ambulatory Visit: Payer: Managed Care, Other (non HMO) | Attending: Gastroenterology

## 2019-12-02 ENCOUNTER — Telehealth: Payer: Self-pay

## 2019-12-02 NOTE — Telephone Encounter (Signed)
Patient is calling needing to rescheduled her Procedure scheduled for 12/03/2019

## 2019-12-02 NOTE — Telephone Encounter (Signed)
Called patient twice today and left her messages to call me back so I could reschedule her procedure. I will try to call her tomorrow in the AM.

## 2019-12-03 ENCOUNTER — Ambulatory Visit
Admission: RE | Admit: 2019-12-03 | Payer: Managed Care, Other (non HMO) | Source: Home / Self Care | Admitting: Gastroenterology

## 2019-12-03 ENCOUNTER — Encounter: Admission: RE | Payer: Self-pay | Source: Home / Self Care

## 2019-12-03 SURGERY — ESOPHAGOGASTRODUODENOSCOPY (EGD) WITH PROPOFOL
Anesthesia: General

## 2019-12-05 NOTE — Telephone Encounter (Signed)
Called patient again and she did not answer. I left a voicemail to call us back to reschedule her procedure. I will go ahead and send her a letter to call us back so we could reschedule her procedure.

## 2019-12-09 LAB — SURGICAL PATHOLOGY

## 2019-12-17 ENCOUNTER — Telehealth: Payer: Self-pay

## 2019-12-17 NOTE — Telephone Encounter (Signed)
Patient called and left a voicemail to call her back and reschedule her procedures. I then called her back to her cell and home phone and had to leave her a voicemail to return my call.

## 2019-12-23 ENCOUNTER — Other Ambulatory Visit: Payer: Self-pay

## 2019-12-23 DIAGNOSIS — D509 Iron deficiency anemia, unspecified: Secondary | ICD-10-CM

## 2019-12-23 DIAGNOSIS — R748 Abnormal levels of other serum enzymes: Secondary | ICD-10-CM

## 2019-12-23 NOTE — Telephone Encounter (Signed)
Patient called back and EGD and colonoscopy had been scheduled for 01/02/2020.

## 2019-12-31 ENCOUNTER — Other Ambulatory Visit: Admission: RE | Admit: 2019-12-31 | Payer: Managed Care, Other (non HMO) | Source: Ambulatory Visit

## 2020-01-02 ENCOUNTER — Encounter: Admission: RE | Payer: Self-pay | Source: Home / Self Care

## 2020-01-02 ENCOUNTER — Ambulatory Visit
Admission: RE | Admit: 2020-01-02 | Payer: Managed Care, Other (non HMO) | Source: Home / Self Care | Admitting: Gastroenterology

## 2020-01-02 SURGERY — COLONOSCOPY WITH PROPOFOL
Anesthesia: General

## 2020-01-08 ENCOUNTER — Telehealth: Payer: Self-pay

## 2020-01-08 DIAGNOSIS — K7581 Nonalcoholic steatohepatitis (NASH): Secondary | ICD-10-CM

## 2020-01-08 NOTE — Telephone Encounter (Signed)
Per Dr. Michele Mcalpine message:  liver biopsy states that the elevation in her liver enzymes may be due to steatohepatitis, or Abigail Cunningham and is not convincing of autoimmune hepatitis.  However, it could also be due to her history of autoimmune hepatitis but it is unclear.  Due to her previous history and unclear results, I would like to refer her to [facility] hepatology since she has been seen there before for this, for a second opinion.  Referral was faxed to [facility] at [phone], phone number: [phone]. They stated that they would contact the patient to schedule appointment.

## 2020-01-22 ENCOUNTER — Telehealth: Payer: Self-pay

## 2020-01-22 NOTE — Telephone Encounter (Signed)
[facility] to ask if they have received patient's referral and if it had been scheduled. I was told by Franchot Mimes (receprionist) that they have tried several times to contact the patient by phone several times and sent her a letter to contact them back to schedule her an appointment on 01/16/2020 and they have still not heard from the patient. I will let Dr. Bonna Gains know. I also called patient and she did not answer, therefore, I left her their phone number for her to call them to schedule an appointment.

## 2020-02-04 ENCOUNTER — Ambulatory Visit: Payer: Managed Care, Other (non HMO) | Admitting: Gastroenterology

## 2020-02-11 ENCOUNTER — Telehealth: Payer: Self-pay | Admitting: Licensed Clinical Social Worker

## 2020-02-11 NOTE — Telephone Encounter (Signed)
LCSW received vm from [place] in clerical that patient called and was looking for services for anxiety and depression.

## 2020-02-14 ENCOUNTER — Inpatient Hospital Stay: Payer: Managed Care, Other (non HMO) | Attending: Oncology

## 2020-03-17 ENCOUNTER — Encounter: Payer: Self-pay | Admitting: Emergency Medicine

## 2020-03-17 ENCOUNTER — Emergency Department
Admission: EM | Admit: 2020-03-17 | Discharge: 2020-03-17 | Disposition: A | Discharge status: Home / Self Care | Payer: Managed Care, Other (non HMO) | Attending: Emergency Medicine | Admitting: Emergency Medicine

## 2020-03-17 ENCOUNTER — Other Ambulatory Visit: Payer: Self-pay

## 2020-03-17 ENCOUNTER — Emergency Department: Payer: Managed Care, Other (non HMO)

## 2020-03-17 DIAGNOSIS — J45909 Unspecified asthma, uncomplicated: Secondary | ICD-10-CM | POA: Diagnosis not present

## 2020-03-17 DIAGNOSIS — Z20822 Contact with and (suspected) exposure to covid-19: Secondary | ICD-10-CM | POA: Insufficient documentation

## 2020-03-17 DIAGNOSIS — J111 Influenza due to unidentified influenza virus with other respiratory manifestations: Secondary | ICD-10-CM

## 2020-03-17 DIAGNOSIS — E860 Dehydration: Secondary | ICD-10-CM

## 2020-03-17 DIAGNOSIS — R112 Nausea with vomiting, unspecified: Secondary | ICD-10-CM | POA: Diagnosis not present

## 2020-03-17 DIAGNOSIS — F17210 Nicotine dependence, cigarettes, uncomplicated: Secondary | ICD-10-CM | POA: Insufficient documentation

## 2020-03-17 DIAGNOSIS — R519 Headache, unspecified: Secondary | ICD-10-CM | POA: Diagnosis present

## 2020-03-17 LAB — URINALYSIS, COMPLETE (UACMP) WITH MICROSCOPIC
Bilirubin Urine: NEGATIVE
Glucose, UA: NEGATIVE mg/dL
Hgb urine dipstick: NEGATIVE
Ketones, ur: NEGATIVE mg/dL
Nitrite: NEGATIVE
Protein, ur: NEGATIVE mg/dL
Specific Gravity, Urine: 1.013 (ref 1.005–1.030)
pH: 7 (ref 5.0–8.0)

## 2020-03-17 LAB — COMPREHENSIVE METABOLIC PANEL
ALT: 50 U/L — ABNORMAL HIGH (ref 0–44)
AST: 52 U/L — ABNORMAL HIGH (ref 15–41)
Albumin: 3.9 g/dL (ref 3.5–5.0)
Alkaline Phosphatase: 53 U/L (ref 38–126)
Anion gap: 6 (ref 5–15)
BUN: 7 mg/dL (ref 6–20)
CO2: 25 mmol/L (ref 22–32)
Calcium: 8.8 mg/dL — ABNORMAL LOW (ref 8.9–10.3)
Chloride: 107 mmol/L (ref 98–111)
Creatinine, Ser: 0.7 mg/dL (ref 0.44–1.00)
GFR, Estimated: >60 mL/min (ref >60)
Glucose, Bld: 118 mg/dL — ABNORMAL HIGH (ref 70–99)
Potassium: 3.9 mmol/L (ref 3.5–5.1)
Sodium: 138 mmol/L (ref 135–145)
Total Bilirubin: 0.6 mg/dL (ref 0.3–1.2)
Total Protein: 7.2 g/dL (ref 6.5–8.1)

## 2020-03-17 LAB — CBC
HCT: 38 % (ref 36.0–46.0)
Hemoglobin: 12.5 g/dL (ref 12.0–15.0)
MCH: 29.1 pg (ref 26.0–34.0)
MCHC: 32.9 g/dL (ref 30.0–36.0)
MCV: 88.6 fL (ref 80.0–100.0)
Platelets: 153 10*3/uL (ref 150–400)
RBC: 4.29 MIL/uL (ref 3.87–5.11)
RDW: 13.3 % (ref 11.5–15.5)
WBC: 5.7 10*3/uL (ref 4.0–10.5)
nRBC: 0 % (ref 0.0–0.2)

## 2020-03-17 LAB — RESPIRATORY PANEL BY RT PCR (FLU A&B, COVID)
Influenza A by PCR: NEGATIVE
Influenza B by PCR: NEGATIVE
SARS Coronavirus 2 by RT PCR: NEGATIVE

## 2020-03-17 LAB — PREGNANCY, URINE: Preg Test, Ur: NEGATIVE

## 2020-03-17 LAB — LIPASE, BLOOD: Lipase: 38 U/L (ref 11–51)

## 2020-03-17 MED ORDER — LACTATED RINGERS IV BOLUS
1000.0000 mL | Freq: Once | INTRAVENOUS | Status: AC
  Administered 2020-03-17: 1000 mL via INTRAVENOUS

## 2020-03-17 MED ORDER — FAMOTIDINE 20 MG PO TABS: 20.0000 mg | ORAL_TABLET | Freq: Two times a day (BID) | ORAL | 0 refills | Status: DC

## 2020-03-17 MED ORDER — NAPROXEN 500 MG PO TABS: 500.0000 mg | ORAL_TABLET | Freq: Two times a day (BID) | ORAL | 0 refills | Status: DC

## 2020-03-17 MED ORDER — PANTOPRAZOLE SODIUM 40 MG IV SOLR
40.0000 mg | Freq: Once | INTRAVENOUS | Status: AC
  Administered 2020-03-17: 40 mg via INTRAVENOUS
  Filled 2020-03-17: qty 40

## 2020-03-17 MED ORDER — ONDANSETRON HCL 4 MG/2ML IJ SOLN
4.0000 mg | Freq: Once | INTRAMUSCULAR | Status: AC
  Administered 2020-03-17: 4 mg via INTRAVENOUS
  Filled 2020-03-17: qty 2

## 2020-03-17 MED ORDER — DEXAMETHASONE SODIUM PHOSPHATE 10 MG/ML IJ SOLN
10.0000 mg | Freq: Once | INTRAMUSCULAR | Status: AC
  Administered 2020-03-17: 10 mg via INTRAVENOUS
  Filled 2020-03-17: qty 1

## 2020-03-17 MED ORDER — ONDANSETRON 4 MG PO TBDP: 4.0000 mg | ORAL_TABLET | Freq: Three times a day (TID) | ORAL | 0 refills | Status: DC | PRN

## 2020-03-17 MED ORDER — KETOROLAC TROMETHAMINE 30 MG/ML IJ SOLN
15.0000 mg | INTRAMUSCULAR | Status: AC
  Administered 2020-03-17: 15 mg via INTRAVENOUS
  Filled 2020-03-17: qty 1

## 2020-03-17 NOTE — Discharge Instructions (Addendum)
Your Covid and Flu tests were negative today.

## 2020-03-17 NOTE — ED Triage Notes (Signed)
Pt comes into the ED via POV c/o abdominal pain, N./V, and shortness of breath.  Pt concerned she may have the flu and states she gets either that or bronchitis every year.  Pt admits to having more mucous production and a small cough.  Pt afebrile, with even and unlabored respirations, and in NAD at this time.

## 2020-03-17 NOTE — ED Provider Notes (Signed)
[facility] Emergency Department Provider Note  ____________________________________________  Time seen: Approximately 4:59 PM  I have reviewed the triage vital signs and the nursing notes.   HISTORY  Chief Complaint Shortness of Breath and Abdominal Pain    HPI Abigail Cunningham is a 39 y.o. female with a history of anxiety and asthma who comes ED complaining of generalized abdominal pain, nausea vomiting diarrhea, decreased appetite and oral intake, mild shortness of breath nonproductive cough generalized headache and fatigue, all gradual onset about 5 days ago and continuous, waxing and waning without aggravating or alleviating factors.  She reports she works in [employer].  Currently takes no medications      Past Medical History:  Diagnosis Date  . Anemia   . Asthma   . Borderline personality disorder ([place])   . Bronchitis unk  . Chronic hepatitis ([place])   . Generalized OA   . Immune deficiency disorder ([place])    chronic autoimmune hepatitis per pt  . Kidney stone   . Migraine      Patient Active Problem List   Diagnosis Date Noted  . Iron deficiency anemia 10/03/2019  . Allergic rhinitis 09/24/2019  . Autoimmune hepatitis ([place]) 09/24/2019  . GERD (gastroesophageal reflux disease) 09/24/2019  . Hemorrhoid 09/24/2019  . Migraine 09/24/2019  . Nephrolithiasis 09/24/2019  . Stress incontinence in female 09/24/2019  . Suicide attempt ([place]) 09/24/2019  . Adnexal mass   . Left ovarian cyst 08/09/2019  . Acute pyelonephritis 02/03/2018     Past Surgical History:  Procedure Laterality Date  . ABDOMINAL HYSTERECTOMY    . LAPAROSCOPIC UNILATERAL SALPINGO OOPHERECTOMY Left 08/15/2019   Procedure: LAPAROSCOPIC UNILATERAL SALPINGO OOPHORECTOMY;  Surgeon: Homero Fellers, MD;  Location: [facility] ORS;  Service: Gynecology;  Laterality: Left;  . LITHOTRIPSY    . OVARIAN CYST REMOVAL       Prior to Admission medications   Medication Sig  Start Date End Date Taking? Authorizing Provider  albuterol (VENTOLIN HFA) 108 (90 Base) MCG/ACT inhaler Inhale into the lungs every 6 (six) hours as needed for wheezing or shortness of breath.    [provider]  famotidine (PEPCID) 20 MG tablet Take 1 tablet (20 mg total) by mouth 2 (two) times daily. 03/17/20   Carrie Mew, MD  folic acid (FOLVITE) 1 MG tablet Take 1 tablet (1 mg total) by mouth daily. 11/20/19   Sindy Guadeloupe, MD  ibuprofen (ADVIL) 200 MG tablet Take 200 mg by mouth every 6 (six) hours as needed.    [provider]  Na Sulfate-K Sulfate-Mg Sulf 17.5-3.13-1.6 GM/177ML SOLN At 5 PM the day before procedure take 1 bottle and 5 hours before procedure take 1 bottle. 11/21/19   Virgel Manifold, MD  naproxen (NAPROSYN) 500 MG tablet Take 1 tablet (500 mg total) by mouth 2 (two) times daily with a meal. 03/17/20   Carrie Mew, MD  omeprazole (PRILOSEC) 40 MG capsule Take 1 capsule (40 mg total) by mouth daily. Patient not taking: Reported on 11/15/2019 09/26/19   Virgel Manifold, MD  ondansetron (ZOFRAN ODT) 4 MG disintegrating tablet Take 1 tablet (4 mg total) by mouth every 8 (eight) hours as needed for nausea or vomiting. 03/17/20   Carrie Mew, MD  Sod Picosulfate-Mag Ox-Cit [facility]) 10-3.5-12 MG-GM -GM/160ML SOLN Take 1 bottle at 5 PM followed by five 8 oz cups of water and repeat 5 hours before procedure. 11/21/19   Virgel Manifold, MD  Sodium Sulfate-Mag Sulfate-KCl ([place])  [phone] MG TABS At 5 PM take 12 tablets using the 8 oz cup provided in the kit drinking 5 cups of water and 5 hours before your procedure repeat the same process. 11/21/19   Virgel Manifold, MD     Allergies Haldol [haloperidol lactate]   Family History  Adopted: Yes    Social History Social History   Tobacco Use  . Smoking status: Current Some Day Smoker    Packs/day: 1.00    Types: Cigarettes  . Smokeless tobacco: Never Used  . Tobacco  comment: 1 cigarette per day   Vaping Use  . Vaping Use: Never used  Substance Use Topics  . Alcohol use: No  . Drug use: Never    Review of Systems  Constitutional:   No fever positive chills.  Positive body aches and fatigue ENT:   No sore throat. No rhinorrhea. Cardiovascular:   No chest pain or syncope. Respiratory:   Positive shortness of breath and nonproductive cough. Gastrointestinal:   Positive as above for abdominal pain, vomiting and diarrhea.  Musculoskeletal:   Negative for focal pain or swelling All other systems reviewed and are negative except as documented above in ROS and HPI.  ____________________________________________   PHYSICAL EXAM:  VITAL SIGNS: ED Triage Vitals  Enc Vitals Group     BP 03/17/20 1043 136/82     Pulse Rate 03/17/20 1043 74     Resp 03/17/20 1043 20     Temp 03/17/20 1043 98.9 F (37.2 C)     Temp Source 03/17/20 1043 Oral     SpO2 03/17/20 1043 100 %     Weight 03/17/20 1044 195 lb (88.5 kg)     Height 03/17/20 1044 _0  (1.575 m)     Head Circumference --      Peak Flow --      Pain Score 03/17/20 1044 6     Pain Loc --      Pain Edu? --      Excl. in [place]? --     Vital signs reviewed, nursing assessments reviewed.   Constitutional:   Alert and oriented. Non-toxic appearance. Eyes:   Conjunctivae are normal. EOMI. PERRL. ENT      Head:   Normocephalic and atraumatic.      Nose:   Wearing a mask.      Mouth/Throat:   Wearing a mask.      Neck:   No meningismus. Full ROM. Hematological/Lymphatic/Immunilogical:   No cervical lymphadenopathy. Cardiovascular:   RRR. Symmetric bilateral radial and DP pulses.  No murmurs. Cap refill less than 2 seconds. Respiratory:   Normal respiratory effort without tachypnea/retractions. Breath sounds are clear and equal bilaterally. No wheezes/rales/rhonchi. Gastrointestinal:   Soft with left upper quadrant tenderness. Non distended. There is no CVA tenderness.  No rebound, rigidity, or  guarding.  Musculoskeletal:   Normal range of motion in all extremities. No joint effusions.  No lower extremity tenderness.  No edema. Neurologic:   Normal speech and language.  Motor grossly intact. No acute focal neurologic deficits are appreciated.  Skin:    Skin is warm, dry and intact. No rash noted.  No petechiae, purpura, or bullae.  ____________________________________________    LABS (pertinent positives/negatives) (all labs ordered are listed, but only abnormal results are displayed) Labs Reviewed  COMPREHENSIVE METABOLIC PANEL - Abnormal; Notable for the following components:      Result Value   Glucose, Bld 118 (*)    Calcium 8.8 (*)  AST 52 (*)    ALT 50 (*)    All other components within normal limits  URINALYSIS, COMPLETE (UACMP) WITH MICROSCOPIC - Abnormal; Notable for the following components:   Color, Urine YELLOW (*)    APPearance HAZY (*)    Leukocytes,Ua SMALL (*)    Bacteria, UA RARE (*)    All other components within normal limits  RESPIRATORY PANEL BY RT PCR (FLU A&B, COVID)  LIPASE, BLOOD  CBC  PREGNANCY, URINE  POC URINE PREG, ED   ____________________________________________   EKG  Interpreted by me  Date: 03/17/2020  Rate: 65  Rhythm: normal sinus rhythm  QRS Axis: normal  Intervals: normal  ST/T Wave abnormalities: normal  Conduction Disutrbances: none  Narrative Interpretation: unremarkable      ____________________________________________    RADIOLOGY  DG Chest 2 View  Result Date: 03/17/2020 CLINICAL DATA:  Shortness of breath. EXAM: CHEST - 2 VIEW COMPARISON:  01/07/2013. FINDINGS: Mediastinum and hilar structures normal. Lungs are clear. No pleural effusion or pneumothorax. Heart size normal. Degenerative changes scoliosis thoracic spine. IMPRESSION: No acute cardiopulmonary disease. Electronically Signed   By: Marcello Moores  Register   On: 03/17/2020 11:24     ____________________________________________   PROCEDURES Procedures  ____________________________________________  DIFFERENTIAL DIAGNOSIS   Viral syndrome, Covid, dehydration, electrolyte abnormality  CLINICAL IMPRESSION / ASSESSMENT AND PLAN / ED COURSE  Medications ordered in the ED: Medications  lactated ringers bolus 1,000 mL (1,000 mLs Intravenous New Bag/Given 03/17/20 1648)  ketorolac (TORADOL) 30 MG/ML injection 15 mg (15 mg Intravenous Given 03/17/20 1649)  dexamethasone (DECADRON) injection 10 mg (10 mg Intravenous Given 03/17/20 1649)  pantoprazole (PROTONIX) injection 40 mg (40 mg Intravenous Given 03/17/20 1649)  ondansetron (ZOFRAN) injection 4 mg (4 mg Intravenous Given 03/17/20 1648)    Pertinent labs & imaging results that were available during my care of the patient were reviewed by me and considered in my medical decision making (see chart for details).  MAUDEAN HOFFMANN was evaluated in Emergency Department on 03/17/2020 for the symptoms described in the history of present illness. She was evaluated in the context of the global COVID-19 pandemic, which necessitated consideration that the patient might be at risk for infection with the SARS-CoV-2 virus that causes COVID-19. Institutional protocols and algorithms that pertain to the evaluation of patients at risk for COVID-19 are in a state of rapid change based on information released by regulatory bodies including the CDC and federal and state organizations. These policies and algorithms were followed during the patient's care in the ED.   Patient presents with multitude of symptoms consistent with a viral illness.  Will send flu Covid swab.  Vital signs are normal, exam is reassuring and suggestive of some gastritis, compatible with her symptomatology.  Labs and urinalysis are all normal.  Chest x-ray image viewed by me and is unremarkable, radiology report agrees  I give the patient IV fluid bolus, IV Decadron  Toradol Protonix and Zofran for symptom relief and p.o. trial.  If feeling better, she can be discharged home on Pepcid and Zofran for supportive care.   ----------------------------------------- 6:49 PM on 03/17/2020 -----------------------------------------  Covid and flu negative.  Vital signs remain normal, stable for discharge.     ____________________________________________   FINAL CLINICAL IMPRESSION(S) / ED DIAGNOSES    Final diagnoses:  Influenza-like illness  Dehydration     ED Discharge Orders         Ordered    naproxen (NAPROSYN) 500 MG tablet  2 times  daily with meals        03/17/20 1659    famotidine (PEPCID) 20 MG tablet  2 times daily        03/17/20 1659    ondansetron (ZOFRAN ODT) 4 MG disintegrating tablet  Every 8 hours PRN        03/17/20 1659          Portions of this note were generated with dragon dictation software. Dictation errors may occur despite best attempts at proofreading.   Carrie Mew, MD 03/17/20 [phone]

## 2020-03-19 ENCOUNTER — Other Ambulatory Visit: Payer: Self-pay

## 2020-03-19 ENCOUNTER — Emergency Department
Admission: EM | Admit: 2020-03-19 | Discharge: 2020-03-19 | Disposition: A | Discharge status: Home / Self Care | Payer: Managed Care, Other (non HMO) | Attending: Emergency Medicine | Admitting: Emergency Medicine

## 2020-03-19 ENCOUNTER — Encounter: Payer: Self-pay | Admitting: Emergency Medicine

## 2020-03-19 DIAGNOSIS — R059 Cough, unspecified: Secondary | ICD-10-CM | POA: Diagnosis present

## 2020-03-19 DIAGNOSIS — F17210 Nicotine dependence, cigarettes, uncomplicated: Secondary | ICD-10-CM | POA: Diagnosis not present

## 2020-03-19 DIAGNOSIS — J45909 Unspecified asthma, uncomplicated: Secondary | ICD-10-CM | POA: Insufficient documentation

## 2020-03-19 DIAGNOSIS — J069 Acute upper respiratory infection, unspecified: Secondary | ICD-10-CM

## 2020-03-19 MED ORDER — ONDANSETRON 4 MG PO TBDP: 4.0000 mg | ORAL_TABLET | Freq: Three times a day (TID) | ORAL | 0 refills | Status: DC | PRN

## 2020-03-19 MED ORDER — NAPROXEN 500 MG PO TABS: 500.0000 mg | ORAL_TABLET | Freq: Two times a day (BID) | ORAL | 0 refills | Status: DC

## 2020-03-19 MED ORDER — FAMOTIDINE 20 MG PO TABS: 20.0000 mg | ORAL_TABLET | Freq: Two times a day (BID) | ORAL | 0 refills | Status: DC

## 2020-03-19 NOTE — ED Triage Notes (Signed)
Seen through ED two days ago for URI.  Arrives today with c/o not improving and worsening cough.  AAOx3.  Skin warm and dry. NAD.  No SOB/ DOE.  Reviewing AVS from 11/2 visit, patient was not aware that RX were sent to pharmacy.

## 2020-03-19 NOTE — ED Notes (Signed)
Signature pad not working, pt verbalizes understanding of d/c instructions, when to return to ER, denies questions or concerns.  NAD noted, steady gait

## 2020-03-19 NOTE — ED Provider Notes (Signed)
[facility] Emergency Department Provider Note  ____________________________________________   First MD Initiated Contact with Patient 03/19/20 1454     (approximate)  I have reviewed the triage vital signs and the nursing notes.   HISTORY  Chief Complaint Cough and URI  HPI Abigail Cunningham is a 39 y.o. female recently seen in our emergency room 2 days ago for URI.  She arrives today because she states the medicines that were prescribed to her were not at the pharmacy and her cough has continued.  She does not have any worsening complaints, cough is stable as compared to before.  No shortness of breath at rest or with exertion.  Denies chest pain, fevers.         Past Medical History:  Diagnosis Date  . Anemia   . Asthma   . Borderline personality disorder ([place])   . Bronchitis unk  . Chronic hepatitis ([place])   . Generalized OA   . Immune deficiency disorder ([place])    chronic autoimmune hepatitis per pt  . Kidney stone   . Migraine     Patient Active Problem List   Diagnosis Date Noted  . Iron deficiency anemia 10/03/2019  . Allergic rhinitis 09/24/2019  . Autoimmune hepatitis ([place]) 09/24/2019  . GERD (gastroesophageal reflux disease) 09/24/2019  . Hemorrhoid 09/24/2019  . Migraine 09/24/2019  . Nephrolithiasis 09/24/2019  . Stress incontinence in female 09/24/2019  . Suicide attempt ([place]) 09/24/2019  . Adnexal mass   . Left ovarian cyst 08/09/2019  . Acute pyelonephritis 02/03/2018    Past Surgical History:  Procedure Laterality Date  . ABDOMINAL HYSTERECTOMY    . LAPAROSCOPIC UNILATERAL SALPINGO OOPHERECTOMY Left 08/15/2019   Procedure: LAPAROSCOPIC UNILATERAL SALPINGO OOPHORECTOMY;  Surgeon: Homero Fellers, MD;  Location: [facility] ORS;  Service: Gynecology;  Laterality: Left;  . LITHOTRIPSY    . OVARIAN CYST REMOVAL      Prior to Admission medications   Medication Sig Start Date End Date Taking? Authorizing Provider   albuterol (VENTOLIN HFA) 108 (90 Base) MCG/ACT inhaler Inhale into the lungs every 6 (six) hours as needed for wheezing or shortness of breath.    [provider]  famotidine (PEPCID) 20 MG tablet Take 1 tablet (20 mg total) by mouth 2 (two) times daily. 03/19/20   Marlana Salvage, PA  folic acid (FOLVITE) 1 MG tablet Take 1 tablet (1 mg total) by mouth daily. 11/20/19   Sindy Guadeloupe, MD  ibuprofen (ADVIL) 200 MG tablet Take 200 mg by mouth every 6 (six) hours as needed.    [provider]  Na Sulfate-K Sulfate-Mg Sulf 17.5-3.13-1.6 GM/177ML SOLN At 5 PM the day before procedure take 1 bottle and 5 hours before procedure take 1 bottle. 11/21/19   Virgel Manifold, MD  naproxen (NAPROSYN) 500 MG tablet Take 1 tablet (500 mg total) by mouth 2 (two) times daily with a meal. 03/19/20   Jadalynn Burr, Farrel Gordon, PA  omeprazole (PRILOSEC) 40 MG capsule Take 1 capsule (40 mg total) by mouth daily. Patient not taking: Reported on 11/15/2019 09/26/19   Virgel Manifold, MD  ondansetron (ZOFRAN ODT) 4 MG disintegrating tablet Take 1 tablet (4 mg total) by mouth every 8 (eight) hours as needed for nausea or vomiting. 03/19/20   Kassey Laforest, Farrel Gordon, PA  Sod Picosulfate-Mag Ox-Cit Acd (CLENPIQ) 10-3.5-12 MG-GM -GM/160ML SOLN Take 1 bottle at 5 PM followed by five 8 oz cups of water and repeat 5 hours before procedure. 11/21/19  Virgel Manifold, MD  Sodium Sulfate-Mag Sulfate-KCl (SUTAB) [phone] MG TABS At 5 PM take 12 tablets using the 8 oz cup provided in the kit drinking 5 cups of water and 5 hours before your procedure repeat the same process. 11/21/19   Virgel Manifold, MD    Allergies Haldol [haloperidol lactate]  Family History  Adopted: Yes    Social History Social History   Tobacco Use  . Smoking status: Current Some Day Smoker    Packs/day: 1.00    Types: Cigarettes  . Smokeless tobacco: Never Used  . Tobacco comment: 1 cigarette per day   Vaping Use  . Vaping  Use: Never used  Substance Use Topics  . Alcohol use: No  . Drug use: Never    Review of Systems Constitutional: No fever/chills Eyes: No visual changes. ENT: + sore throat. Cardiovascular: Denies chest pain. Respiratory: + Cough, denies shortness of breath. Gastrointestinal: No abdominal pain.  No nausea, no vomiting.  No diarrhea.  No constipation. Genitourinary: Negative for dysuria. Musculoskeletal: Negative for back pain. Skin: Negative for rash. Neurological: Negative for headaches, focal weakness or numbness.  ____________________________________________   PHYSICAL EXAM:  VITAL SIGNS: ED Triage Vitals  Enc Vitals Group     BP 03/19/20 1414 121/66     Pulse Rate 03/19/20 1414 84     Resp 03/19/20 1414 18     Temp 03/19/20 1414 99.1 F (37.3 C)     Temp Source 03/19/20 1414 Oral     SpO2 03/19/20 1414 99 %     Weight 03/19/20 1425 195 lb 1.7 oz (88.5 kg)     Height 03/19/20 1425 _0  (1.575 m)     Head Circumference --      Peak Flow --      Pain Score 03/19/20 1425 0     Pain Loc --      Pain Edu? --      Excl. in [place]? --     Constitutional: Alert and oriented. Well appearing and in no acute distress. Eyes: Conjunctivae are normal.  Head: Atraumatic. Nose: + congestion/rhinnorhea. Mouth/Throat: Mucous membranes are moist.   Neck: No stridor.   Cardiovascular: Normal rate, regular rhythm. Grossly normal heart sounds.  Good peripheral circulation. Respiratory: Normal respiratory effort.  No retractions. Lungs CTAB. Gastrointestinal: Soft and nontender. No distention. No abdominal bruits. No CVA tenderness. Musculoskeletal: No lower extremity tenderness nor edema.  No joint effusions. Neurologic:  Normal speech and language. No gross focal neurologic deficits are appreciated. No gait instability. Skin:  Skin is warm, dry and intact. No rash noted. Psychiatric: Mood and affect are normal. Speech and behavior are  normal.  ____________________________________________   INITIAL IMPRESSION / ASSESSMENT AND PLAN / ED COURSE  As part of my medical decision making, I reviewed the following data within the [place] notes reviewed and incorporated and Notes from prior ED visits        Patient is a 39 year old female who presents to the emergency department after being seen here 2 days ago for same symptoms.  She displays her AVS paperwork today which reads that she should have been given a paper prescription when she left the facility, however she states that she was not given these.  She would like these medicines sent to the pharmacy.  Overall, the patient has a very reassuring physical exam, lung sounds are clear.  After review of her recent ER visit, it appears that she was supposed to  be prescribed Pepcid, Naprosyn and Zofran.  These were ordered directly to the pharmacy of her choice.  The patient was also given a work note.  She was instructed to return to the emergency department if she has any worsening of symptoms.      ____________________________________________   FINAL CLINICAL IMPRESSION(S) / ED DIAGNOSES  Final diagnoses:  Viral URI with cough     ED Discharge Orders         Ordered    famotidine (PEPCID) 20 MG tablet  2 times daily        03/19/20 1533    naproxen (NAPROSYN) 500 MG tablet  2 times daily with meals        03/19/20 1533    ondansetron (ZOFRAN ODT) 4 MG disintegrating tablet  Every 8 hours PRN        03/19/20 1533          *Please note:  GABRIAL POPPELL was evaluated in Emergency Department on 03/19/2020 for the symptoms described in the history of present illness. She was evaluated in the context of the global COVID-19 pandemic, which necessitated consideration that the patient might be at risk for infection with the SARS-CoV-2 virus that causes COVID-19. Institutional protocols and algorithms that pertain to the evaluation of  patients at risk for COVID-19 are in a state of rapid change based on information released by regulatory bodies including the CDC and federal and state organizations. These policies and algorithms were followed during the patient's care in the ED.  Some ED evaluations and interventions may be delayed as a result of limited staffing during and the pandemic.*   Note:  This document was prepared using Dragon voice recognition software and may include unintentional dictation errors.    Marlana Salvage, PA 03/19/20 2235    Vladimir Crofts, MD 03/19/20 2251

## 2020-03-21 ENCOUNTER — Encounter: Payer: Self-pay | Admitting: Emergency Medicine

## 2020-03-21 ENCOUNTER — Emergency Department
Admission: EM | Admit: 2020-03-21 | Discharge: 2020-03-21 | Disposition: A | Discharge status: Home / Self Care | Payer: Managed Care, Other (non HMO) | Attending: Emergency Medicine | Admitting: Emergency Medicine

## 2020-03-21 ENCOUNTER — Other Ambulatory Visit: Payer: Self-pay

## 2020-03-21 DIAGNOSIS — J069 Acute upper respiratory infection, unspecified: Secondary | ICD-10-CM | POA: Insufficient documentation

## 2020-03-21 DIAGNOSIS — R070 Pain in throat: Secondary | ICD-10-CM | POA: Diagnosis present

## 2020-03-21 DIAGNOSIS — J45909 Unspecified asthma, uncomplicated: Secondary | ICD-10-CM | POA: Diagnosis not present

## 2020-03-21 DIAGNOSIS — Z20822 Contact with and (suspected) exposure to covid-19: Secondary | ICD-10-CM | POA: Diagnosis not present

## 2020-03-21 DIAGNOSIS — F17210 Nicotine dependence, cigarettes, uncomplicated: Secondary | ICD-10-CM | POA: Diagnosis not present

## 2020-03-21 LAB — RESPIRATORY PANEL BY RT PCR (FLU A&B, COVID)
Influenza A by PCR: NEGATIVE
Influenza B by PCR: NEGATIVE
SARS Coronavirus 2 by RT PCR: NEGATIVE

## 2020-03-21 LAB — GROUP A STREP BY PCR: Group A Strep by PCR: NOT DETECTED

## 2020-03-21 MED ORDER — PSEUDOEPH-BROMPHEN-DM 30-2-10 MG/5ML PO SYRP: 5.0000 mL | ORAL_SOLUTION | Freq: Four times a day (QID) | ORAL | 0 refills | Status: DC | PRN

## 2020-03-21 NOTE — ED Notes (Signed)
PT states 3rd visit this week for same s/s.  Pt c/o dry mouth, body aches, sore throat.  States the Prilosec she was prescribed earlier is making her constipated.  States she believes she needs "some cold medicine or something".

## 2020-03-21 NOTE — Discharge Instructions (Signed)
You will need to follow-up with your primary care provider if any continued problems or concerns.  Begin taking Bromfed-DM as needed for cough and congestion.  Increase fluids to stay hydrated.  Tylenol if needed for headache or sore throat.  Covid, influenza and strep test were all negative.

## 2020-03-21 NOTE — ED Provider Notes (Signed)
[facility] Emergency Department Provider Note   ____________________________________________   First MD Initiated Contact with Patient 03/21/20 [phone]     (approximate)  I have reviewed the triage vital signs and the nursing notes.   HISTORY  Chief Complaint Nasal Congestion   HPI Abigail Cunningham is a 39 y.o. female presents to the ED with complaint of body aches, sore throat and dry mouth. Patient states that this is her third visit this week for the same symptoms. She was placed on Pepcid, naproxen, and Zofran ODT on her last visit. The reason for her second visit is she did not have the medication at the pharmacy that was supposed to be sent in her first visit. Patient continues to deny any fever. She is still unaware of any exposure to strep or Covid. She denies any other symptoms. Today she is afebrile with an O2 sat of 100%.         Past Medical History:  Diagnosis Date  . Anemia   . Asthma   . Borderline personality disorder ([place])   . Bronchitis unk  . Chronic hepatitis ([place])   . Generalized OA   . Immune deficiency disorder ([place])    chronic autoimmune hepatitis per pt  . Kidney stone   . Migraine     Patient Active Problem List   Diagnosis Date Noted  . Iron deficiency anemia 10/03/2019  . Allergic rhinitis 09/24/2019  . Autoimmune hepatitis ([place]) 09/24/2019  . GERD (gastroesophageal reflux disease) 09/24/2019  . Hemorrhoid 09/24/2019  . Migraine 09/24/2019  . Nephrolithiasis 09/24/2019  . Stress incontinence in female 09/24/2019  . Suicide attempt ([place]) 09/24/2019  . Adnexal mass   . Left ovarian cyst 08/09/2019  . Acute pyelonephritis 02/03/2018    Past Surgical History:  Procedure Laterality Date  . ABDOMINAL HYSTERECTOMY    . LAPAROSCOPIC UNILATERAL SALPINGO OOPHERECTOMY Left 08/15/2019   Procedure: LAPAROSCOPIC UNILATERAL SALPINGO OOPHORECTOMY;  Surgeon: Homero Fellers, MD;  Location: [facility] ORS;  Service:  Gynecology;  Laterality: Left;  . LITHOTRIPSY    . OVARIAN CYST REMOVAL      Prior to Admission medications   Medication Sig Start Date End Date Taking? Authorizing Provider  albuterol (VENTOLIN HFA) 108 (90 Base) MCG/ACT inhaler Inhale into the lungs every 6 (six) hours as needed for wheezing or shortness of breath.    [provider]  brompheniramine-pseudoephedrine-DM 30-2-10 MG/5ML syrup Take 5 mLs by mouth 4 (four) times daily as needed. 03/21/20   Johnn Hai, PA-C  famotidine (PEPCID) 20 MG tablet Take 1 tablet (20 mg total) by mouth 2 (two) times daily. 03/19/20   Marlana Salvage, PA  folic acid (FOLVITE) 1 MG tablet Take 1 tablet (1 mg total) by mouth daily. 11/20/19   Sindy Guadeloupe, MD  ibuprofen (ADVIL) 200 MG tablet Take 200 mg by mouth every 6 (six) hours as needed.    [provider]  Na Sulfate-K Sulfate-Mg Sulf 17.5-3.13-1.6 GM/177ML SOLN At 5 PM the day before procedure take 1 bottle and 5 hours before procedure take 1 bottle. 11/21/19   Virgel Manifold, MD  naproxen (NAPROSYN) 500 MG tablet Take 1 tablet (500 mg total) by mouth 2 (two) times daily with a meal. 03/19/20   Rodgers, Farrel Gordon, PA  omeprazole (PRILOSEC) 40 MG capsule Take 1 capsule (40 mg total) by mouth daily. Patient not taking: Reported on 11/15/2019 09/26/19   Virgel Manifold, MD  ondansetron (ZOFRAN ODT) 4 MG disintegrating  tablet Take 1 tablet (4 mg total) by mouth every 8 (eight) hours as needed for nausea or vomiting. 03/19/20   Rodgers, Ruben Gottron, PA  Sod Picosulfate-Mag Ox-Cit Acd (CLENPIQ) 10-3.5-12 MG-GM -GM/160ML SOLN Take 1 bottle at 5 PM followed by five 8 oz cups of water and repeat 5 hours before procedure. 11/21/19   Pasty Spillers, MD  Sodium Sulfate-Mag Sulfate-KCl (SUTAB) [phone] MG TABS At 5 PM take 12 tablets using the 8 oz cup provided in the kit drinking 5 cups of water and 5 hours before your procedure repeat the same process. 11/21/19   Pasty Spillers,  MD    Allergies Haldol [haloperidol lactate]  Family History  Adopted: Yes    Social History Social History   Tobacco Use  . Smoking status: Current Some Day Smoker    Packs/day: 1.00    Types: Cigarettes  . Smokeless tobacco: Never Used  . Tobacco comment: 1 cigarette per day   Vaping Use  . Vaping Use: Never used  Substance Use Topics  . Alcohol use: No  . Drug use: Never    Review of Systems Constitutional: No fever/chills Eyes: No visual changes. ENT: Positive sore throat, congestion, Cardiovascular: Denies chest pain. Respiratory: Denies shortness of breath. Positive occasional cough. Gastrointestinal: No abdominal pain.  No nausea, no vomiting.  No diarrhea.  Genitourinary: Negative for dysuria. Musculoskeletal: Negative for back pain. Skin: Negative for rash. Neurological: Positive for body aches. No focal weakness or numbness. ____________________________________________   PHYSICAL EXAM:  VITAL SIGNS: ED Triage Vitals  Enc Vitals Group     BP 03/21/20 0636 (!) 110/58     Pulse Rate 03/21/20 0636 80     Resp 03/21/20 0636 18     Temp 03/21/20 0636 98.3 F (36.8 C)     Temp Source 03/21/20 0636 Oral     SpO2 03/21/20 0636 100 %     Weight 03/21/20 0637 190 lb (86.2 kg)     Height 03/21/20 0637 5\' 2"  (1.575 m)     Head Circumference --      Peak Flow --      Pain Score 03/21/20 0637 0     Pain Loc --      Pain Edu? --      Excl. in [place]? --     Constitutional: Alert and oriented. Well appearing and in no acute distress. Eyes: Conjunctivae are normal. PERRL. EOMI. Head: Atraumatic. Nose: No congestion/rhinnorhea. Mouth/Throat: Mucous membranes are moist.  Oropharynx non-erythematous. Minimal posterior drainage noted. Neck: No stridor.   Cardiovascular: Normal rate, regular rhythm. Grossly normal heart sounds.  Good peripheral circulation. Respiratory: Normal respiratory effort.  No retractions. Lungs CTAB. Gastrointestinal: Soft and nontender.  No distention.  Musculoskeletal: Moves upper and lower extremities without any difficulty. Normal gait was noted. Neurologic:  Normal speech and language. No gross focal neurologic deficits are appreciated.  Skin:  Skin is warm, dry and intact. No rash noted. Psychiatric: Mood and affect are normal. Speech and behavior are normal.  ____________________________________________   LABS (all labs ordered are listed, but only abnormal results are displayed)  Labs Reviewed  GROUP A STREP BY PCR  RESPIRATORY PANEL BY RT PCR (FLU A&B, COVID)    PROCEDURES  Procedure(s) performed (including Critical Care):  Procedures   ____________________________________________   INITIAL IMPRESSION / ASSESSMENT AND PLAN / ED COURSE  As part of my medical decision making, I reviewed the following data within the electronic MEDICAL RECORD NUMBER Notes from prior  ED visits and [place] Controlled Substance Database  39 year old female presents to the ED with complaint of an occasional cough with sore throat, body aches and dry mouth. She was seen twice this week for similar symptoms. Lab work and test results were all negative and reassuring. Patient also had a chest x-ray earlier in the week. Today there is some minimal upper respiratory congestion and throat shows some minimal posterior drainage. Respiratory panel was negative and strep test also was negative. Patient was made aware. She was given a prescription for Bromfed-DM as needed for cough and congestion. She is encouraged to drink fluids. She was also given a note to return to work on 03/22/2020.  ____________________________________________   FINAL CLINICAL IMPRESSION(S) / ED DIAGNOSES  Final diagnoses:  Viral URI with cough     ED Discharge Orders         Ordered    brompheniramine-pseudoephedrine-DM 30-2-10 MG/5ML syrup  4 times daily PRN        03/21/20 0924          *Please note:  Abigail Cunningham was evaluated in Emergency Department  on 03/21/2020 for the symptoms described in the history of present illness. She was evaluated in the context of the global COVID-19 pandemic, which necessitated consideration that the patient might be at risk for infection with the SARS-CoV-2 virus that causes COVID-19. Institutional protocols and algorithms that pertain to the evaluation of patients at risk for COVID-19 are in a state of rapid change based on information released by regulatory bodies including the CDC and federal and state organizations. These policies and algorithms were followed during the patient's care in the ED.  Some ED evaluations and interventions may be delayed as a result of limited staffing during and the pandemic.*   Note:  This document was prepared using Dragon voice recognition software and may include unintentional dictation errors.    Johnn Hai, PA-C 03/21/20 0941    Lucrezia Starch, MD 03/21/20 [phone]

## 2020-03-21 NOTE — ED Triage Notes (Signed)
Patient with complaint of congestion times a week and half. Patient states that she has been seen here times two for the same. Patient states that she was diagnosed with an upper respiratory infection but that it is not going away.

## 2020-05-21 ENCOUNTER — Inpatient Hospital Stay: Payer: Managed Care, Other (non HMO) | Attending: Oncology

## 2020-05-21 ENCOUNTER — Telehealth: Payer: Self-pay | Admitting: Oncology

## 2020-05-21 ENCOUNTER — Inpatient Hospital Stay: Payer: Managed Care, Other (non HMO) | Admitting: Oncology

## 2020-05-21 NOTE — Telephone Encounter (Signed)
Call placed to pt in regards to missed appointment. Unable to reach pt, left message to return call for rescheduling.

## 2020-12-22 IMAGING — CT CT RENAL STONE PROTOCOL
3 of 4 series · 10 of 46 positions shown, 17 images · non-contrast
Comparison: 02/03/2018

CLINICAL DATA: Right-sided flank pain since yesterday, history
urinary tract infection

EXAM:
CT ABDOMEN AND PELVIS WITHOUT CONTRAST
TECHNIQUE: Multidetector CT imaging of the abdomen and pelvis was performed
following the standard protocol without IV contrast.

[Series 3: lung bases · axial · 0.79mm/px · z∈[-677,-567]mm · 6 of 32 slices shown, 11 images]
[im 5/32  soft-tissue]
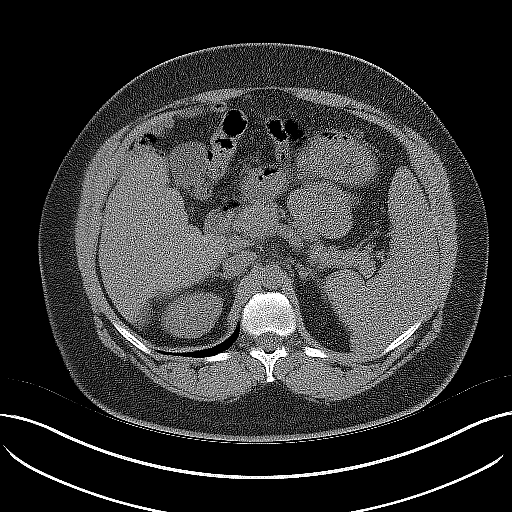
[im 5/32  bone]
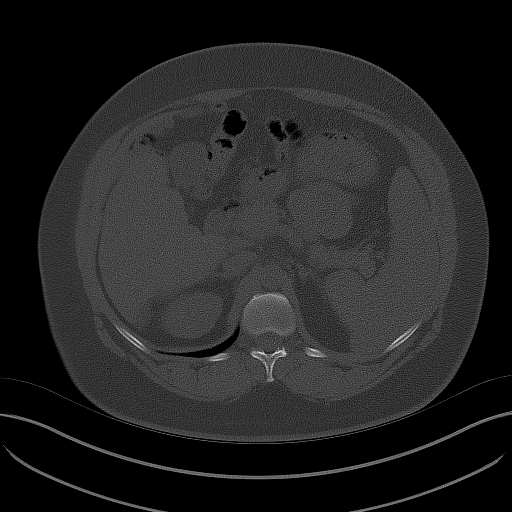
[im 9/32  soft-tissue]
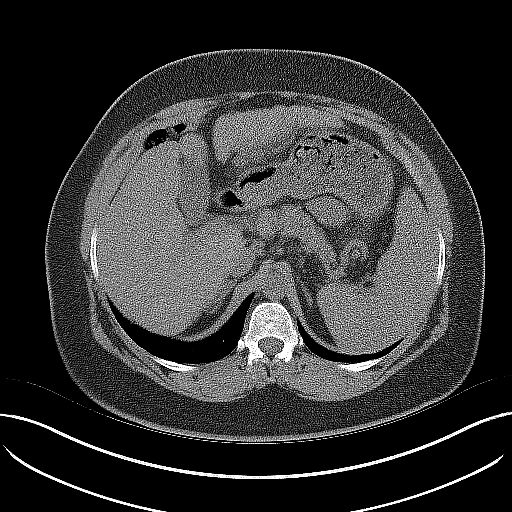
[im 14/32  soft-tissue]
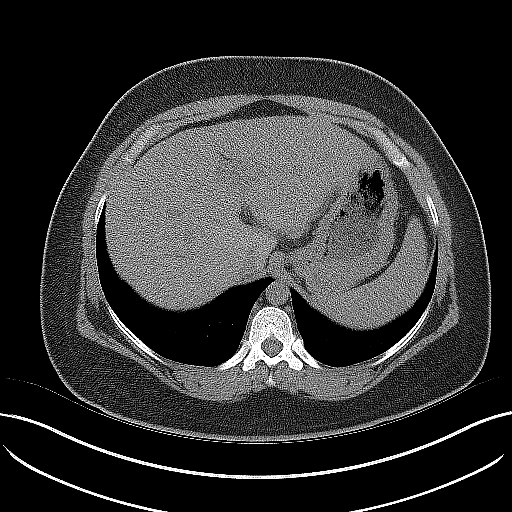
[im 14/32  lung]
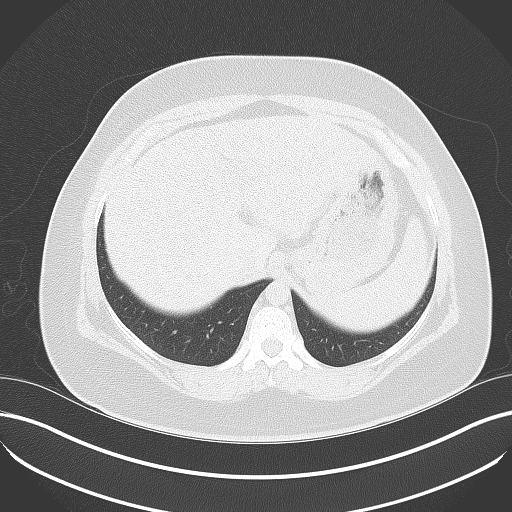
[im 18/32  soft-tissue]
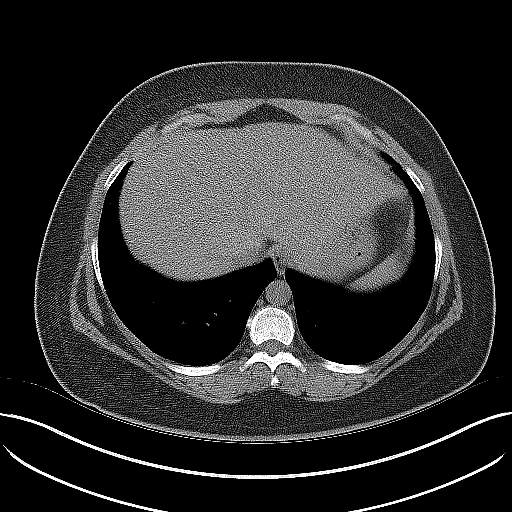
[im 18/32  lung]
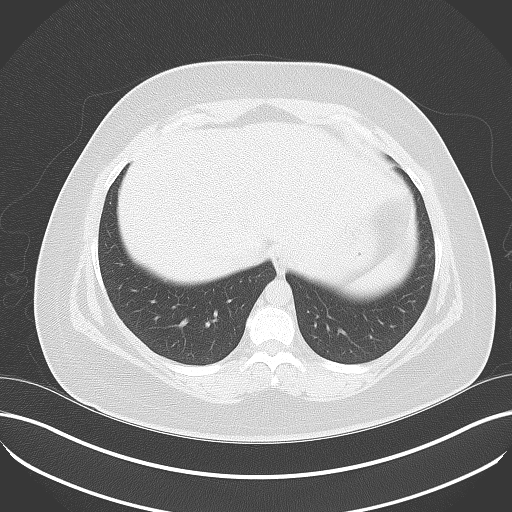
[im 23/32  soft-tissue]
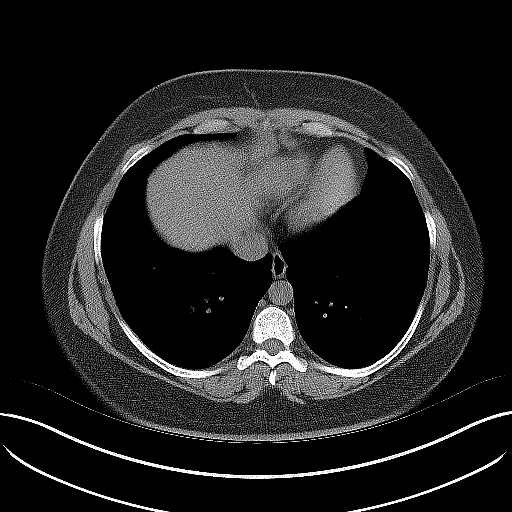
[im 23/32  lung]
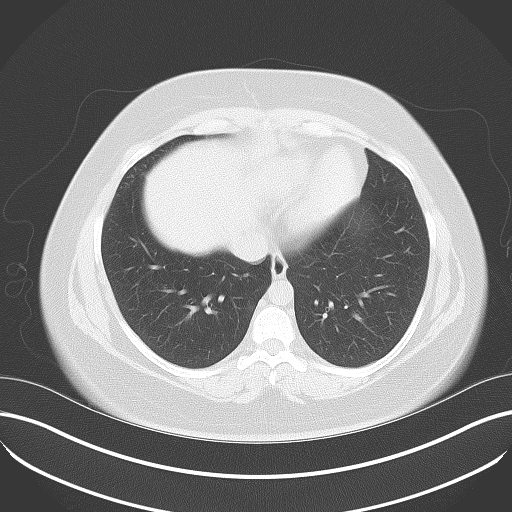
[im 27/32  soft-tissue]
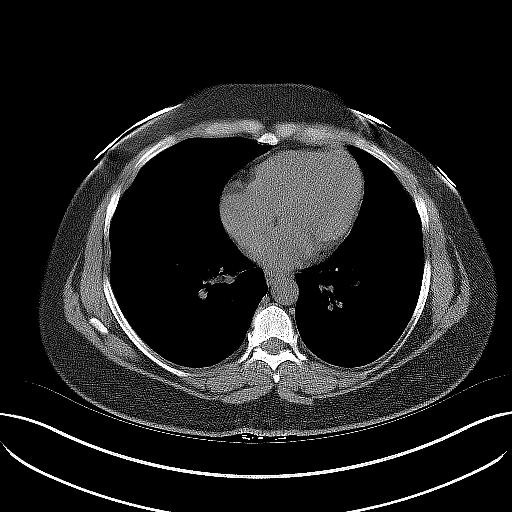
[im 27/32  lung]
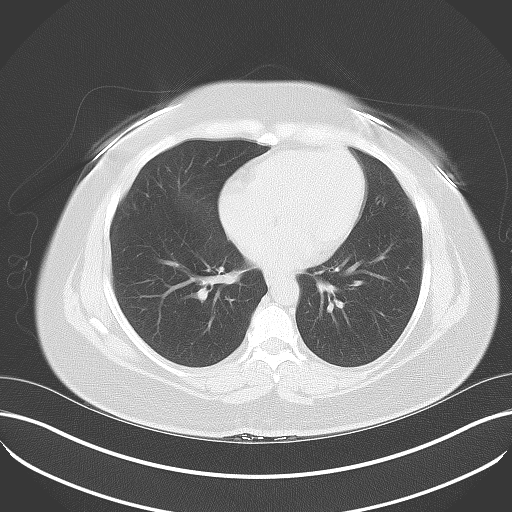

[Series 4: coronal · coronal · 0.86mm/px · 3 of 165 slices shown, 4 images]
[im 55/165  soft-tissue]
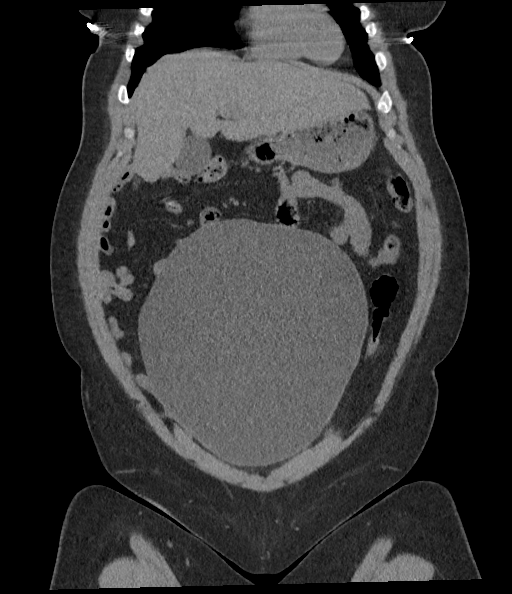
[im 73/165  soft-tissue]
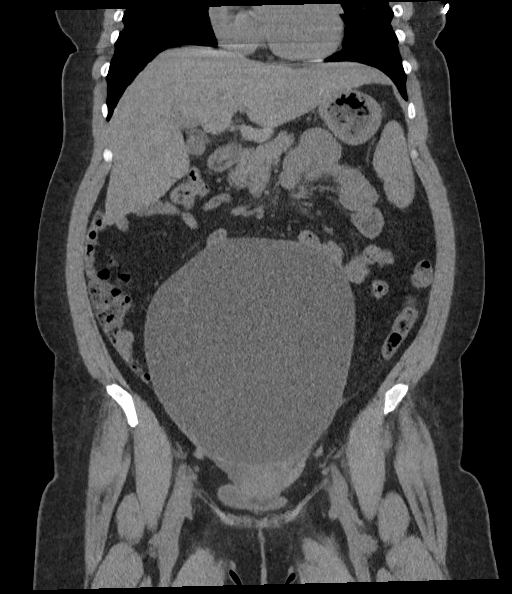
[im 73/165  bone]
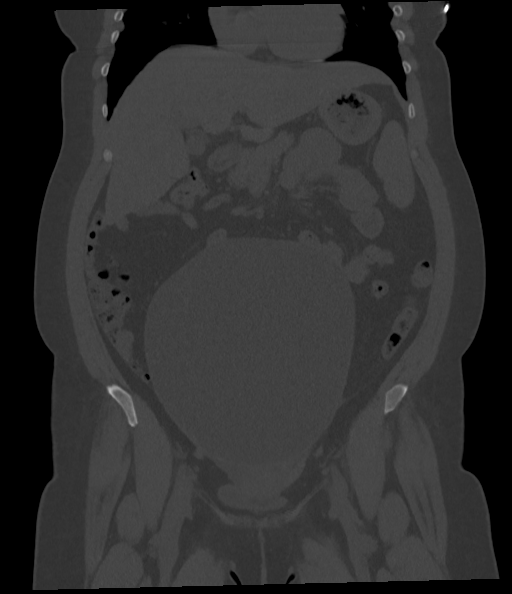
[im 92/165  soft-tissue]
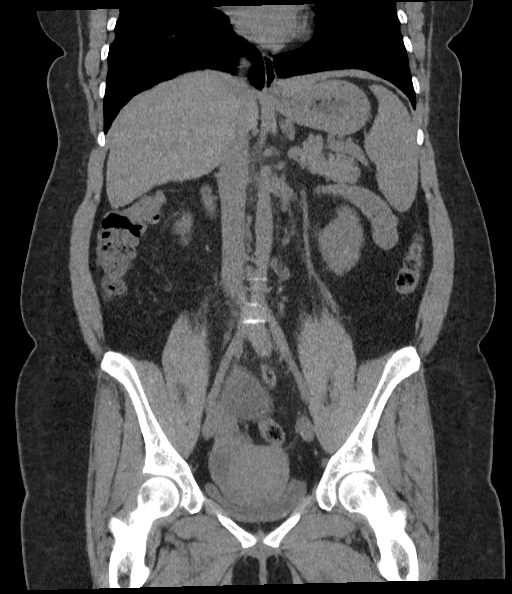

[Series 5: sagittal · sagittal · 0.64mm/px · 1 of 221 slices shown, 2 images]
[im 74/221  soft-tissue]
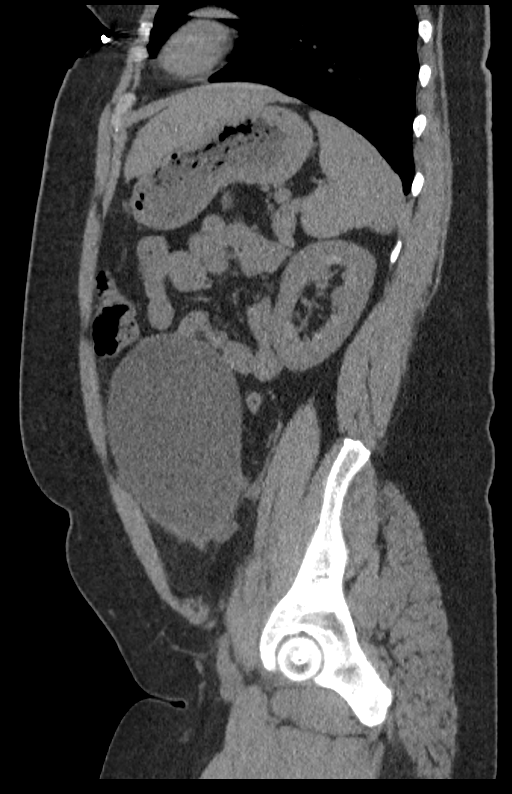
[im 74/221  bone]
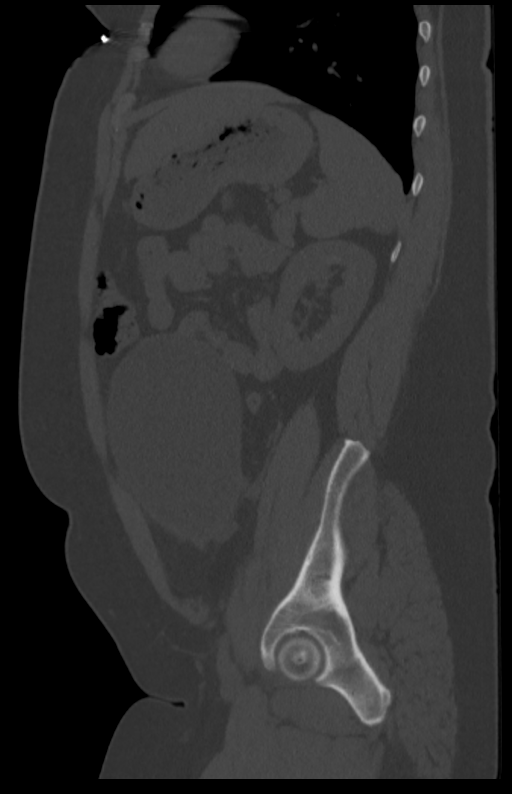

[10 of 46 positions shown; findings below may reference images not displayed]

FINDINGS: Lower chest: Stable subpleural scarring within the right middle
lobe. No acute pleural or parenchymal lung disease.

Hepatobiliary: The liver displays a nodular contour consistent with
cirrhosis. No focal abnormalities on this unenhanced exam. The
gallbladder is unremarkable.

Pancreas: Unremarkable. No pancreatic ductal dilatation or
surrounding inflammatory changes.

Spleen: Spleen is borderline enlarged, spleen is enlarged measuring
15 cm in anterior-posterior dimension.

Adrenals/Urinary Tract: Persistent increased density of the renal
medullary pyramids which may reflect nephrocalcinosis. 3 mm
nonobstructing calculus right kidney reference image 29 of series 2,
unchanged. No obstructive uropathy within either kidney. Ureters are
unremarkable. The bladder is decompressed which limits evaluation.
The adrenals are unremarkable.

Stomach/Bowel: No bowel obstruction or ileus. Normal appendix right
lower quadrant. No bowel wall thickening or inflammatory changes.

Vascular/Lymphatic: No significant vascular findings are present. No
enlarged abdominal or pelvic lymph nodes.

Reproductive: The left adnexal cyst seen on prior studies has
increased in size, now measuring 12.9 by 19.0 cm in transverse
dimension, and extending approximately 21.3 cm in craniocaudal
length. Gynecologic consultation is recommended. If further imaging
is required, MRI could be performed.

There is a small follicle within the right ovary. The uterus is
grossly unremarkable.

Other: No abdominal wall hernia or abnormality. No abdominopelvic
ascites.

Musculoskeletal: No acute or destructive bony lesions. Reconstructed
images demonstrate no additional findings.
IMPRESSION: 1. Enlarging left adnexal cystic mass, now measuring 12.9 x 19.0 x
21.3 cm. Gynecologic consultation is recommended. If further imaging
evaluation is desired, MRI could be performed.
2. Persistent increased density of the renal medullary pyramids may
reflect nephrocalcinosis.
3. Stable 3 mm nonobstructing right renal calculus.
4. Cirrhosis with borderline splenomegaly.

## 2021-01-01 ENCOUNTER — Other Ambulatory Visit: Payer: Self-pay

## 2021-01-01 ENCOUNTER — Emergency Department
Admission: EM | Admit: 2021-01-01 | Discharge: 2021-01-01 | Disposition: A | Discharge status: Home / Self Care | Payer: Managed Care, Other (non HMO) | Attending: Emergency Medicine | Admitting: Emergency Medicine

## 2021-01-01 DIAGNOSIS — F17210 Nicotine dependence, cigarettes, uncomplicated: Secondary | ICD-10-CM | POA: Diagnosis not present

## 2021-01-01 DIAGNOSIS — L509 Urticaria, unspecified: Secondary | ICD-10-CM | POA: Insufficient documentation

## 2021-01-01 DIAGNOSIS — Z20822 Contact with and (suspected) exposure to covid-19: Secondary | ICD-10-CM | POA: Insufficient documentation

## 2021-01-01 DIAGNOSIS — J40 Bronchitis, not specified as acute or chronic: Secondary | ICD-10-CM | POA: Insufficient documentation

## 2021-01-01 DIAGNOSIS — R21 Rash and other nonspecific skin eruption: Secondary | ICD-10-CM | POA: Diagnosis present

## 2021-01-01 LAB — RESP PANEL BY RT-PCR (FLU A&B, COVID) ARPGX2
Influenza A by PCR: NEGATIVE
Influenza B by PCR: NEGATIVE
SARS Coronavirus 2 by RT PCR: NEGATIVE

## 2021-01-01 LAB — COMPREHENSIVE METABOLIC PANEL
ALT: 32 U/L (ref 0–44)
AST: 50 U/L — ABNORMAL HIGH (ref 15–41)
Albumin: 4 g/dL (ref 3.5–5.0)
Alkaline Phosphatase: 56 U/L (ref 38–126)
Anion gap: 8 (ref 5–15)
BUN: 8 mg/dL (ref 6–20)
CO2: 23 mmol/L (ref 22–32)
Calcium: 9 mg/dL (ref 8.9–10.3)
Chloride: 105 mmol/L (ref 98–111)
Creatinine, Ser: 0.72 mg/dL (ref 0.44–1.00)
GFR, Estimated: >60 mL/min (ref >60)
Glucose, Bld: 92 mg/dL (ref 70–99)
Potassium: 3.9 mmol/L (ref 3.5–5.1)
Sodium: 136 mmol/L (ref 135–145)
Total Bilirubin: 0.9 mg/dL (ref 0.3–1.2)
Total Protein: 7.4 g/dL (ref 6.5–8.1)

## 2021-01-01 MED ORDER — LORATADINE 10 MG PO TABS
10.0000 mg | ORAL_TABLET | Freq: Once | ORAL | Status: AC
  Administered 2021-01-01: 10 mg via ORAL
  Filled 2021-01-01: qty 1

## 2021-01-01 NOTE — ED Provider Notes (Signed)
[facility] Emergency Department Provider Note  ____________________________________________   Event Date/Time   First MD Initiated Contact with Patient 01/01/21 1025     (approximate)  I have reviewed the triage vital signs and the nursing notes.   HISTORY  Chief Complaint Rash   HPI Abigail Cunningham is a 39 y.o. female with a past medical history of anemia, asthma, borderline personality disorder, migraine headaches, anxiety and chronic autoimmune hepatitis not currently on any immune suppressing medications who presents for assessment of chills and a rash that she noticed yesterday.  Patient states it is very itchy and she has noticed in her arms and back and some on her chest.  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  She denies any new headache, earache, sore throat, chest pain, cough, vomiting, diarrhea, dysuria, or any lesions in her mouth and genital region.  She has not had any new medicines or other chemical exposures or food she is aware of that may precipitate an allergic reaction.  No prior similar episodes.  No other acute concerns at this time.         Past Medical History:  Diagnosis Date   Anemia    Asthma    Borderline personality disorder ([place])    Bronchitis unk   Chronic hepatitis ([place])    Generalized OA    Immune deficiency disorder (HCC)    chronic autoimmune hepatitis per pt   Kidney stone    Migraine     Patient Active Problem List   Diagnosis Date Noted   Iron deficiency anemia 10/03/2019   Allergic rhinitis 09/24/2019   Autoimmune hepatitis ([place]) 09/24/2019   GERD (gastroesophageal reflux disease) 09/24/2019   Hemorrhoid 09/24/2019   Migraine 09/24/2019   Nephrolithiasis 09/24/2019   Stress incontinence in female 09/24/2019   Suicide attempt ([place]) 09/24/2019   Adnexal mass    Left ovarian cyst 08/09/2019   Acute pyelonephritis 02/03/2018    Past Surgical History:   Procedure Laterality Date   ABDOMINAL HYSTERECTOMY     LAPAROSCOPIC UNILATERAL SALPINGO OOPHERECTOMY Left 08/15/2019   Procedure: LAPAROSCOPIC UNILATERAL SALPINGO OOPHORECTOMY;  Surgeon: Homero Fellers, MD;  Location: [facility] ORS;  Service: Gynecology;  Laterality: Left;   LITHOTRIPSY     OVARIAN CYST REMOVAL      Prior to Admission medications   Medication Sig Start Date End Date Taking? Authorizing Provider  ALPRAZolam Duanne Moron) 1 MG tablet Take 1 mg by mouth at bedtime as needed for anxiety.   Yes [provider]  albuterol (VENTOLIN HFA) 108 (90 Base) MCG/ACT inhaler Inhale into the lungs every 6 (six) hours as needed for wheezing or shortness of breath.    [provider]  brompheniramine-pseudoephedrine-DM 30-2-10 MG/5ML syrup Take 5 mLs by mouth 4 (four) times daily as needed. 03/21/20   Johnn Hai, PA-C  famotidine (PEPCID) 20 MG tablet Take 1 tablet (20 mg total) by mouth 2 (two) times daily. 03/19/20   Marlana Salvage, PA  folic acid (FOLVITE) 1 MG tablet Take 1 tablet (1 mg total) by mouth daily. 11/20/19   Sindy Guadeloupe, MD  ibuprofen (ADVIL) 200 MG tablet Take 200 mg by mouth every 6 (six) hours as needed.    [provider]  Na Sulfate-K Sulfate-Mg Sulf 17.5-3.13-1.6 GM/177ML SOLN At 5 PM the day before procedure take 1 bottle and 5 hours before procedure take 1 bottle. 11/21/19   Vonda Antigua  B, MD  naproxen (NAPROSYN) 500 MG tablet Take 1 tablet (500 mg total) by mouth 2 (two) times daily with a meal. 03/19/20   Rodgers, Farrel Gordon, PA  omeprazole (PRILOSEC) 40 MG capsule Take 1 capsule (40 mg total) by mouth daily. Patient not taking: No sig reported 09/26/19   Virgel Manifold, MD  ondansetron (ZOFRAN ODT) 4 MG disintegrating tablet Take 1 tablet (4 mg total) by mouth every 8 (eight) hours as needed for nausea or vomiting. 03/19/20   Rodgers, Farrel Gordon, PA  Sod Picosulfate-Mag Ox-Cit Acd (CLENPIQ) 10-3.5-12 MG-GM -GM/160ML SOLN Take 1  bottle at 5 PM followed by five 8 oz cups of water and repeat 5 hours before procedure. 11/21/19   Virgel Manifold, MD  Sodium Sulfate-Mag Sulfate-KCl (SUTAB) [phone] MG TABS At 5 PM take 12 tablets using the 8 oz cup provided in the kit drinking 5 cups of water and 5 hours before your procedure repeat the same process. 11/21/19   Virgel Manifold, MD    Allergies Haldol [haloperidol lactate]  Family History  Adopted: Yes    Social History Social History   Tobacco Use   Smoking status: Some Days    Packs/day: 1.00    Types: Cigarettes   Smokeless tobacco: Never   Tobacco comments:    1 cigarette per day   Vaping Use   Vaping Use: Never used  Substance Use Topics   Alcohol use: No   Drug use: Never    Review of Systems  Review of Systems  Constitutional:  Positive for chills and malaise/fatigue. Negative for fever.  HENT:  Negative for sore throat.   Eyes:  Negative for pain.  Respiratory:  Negative for cough and stridor.   Cardiovascular:  Negative for chest pain.  Gastrointestinal:  Negative for vomiting.  Genitourinary:  Negative for dysuria.  Musculoskeletal:  Negative for myalgias.  Skin:  Positive for itching and rash.  Neurological:  Negative for seizures, loss of consciousness and headaches.  Psychiatric/Behavioral:  Negative for suicidal ideas.   All other systems reviewed and are negative.    ____________________________________________   PHYSICAL EXAM:  VITAL SIGNS: ED Triage Vitals  Enc Vitals Group     BP 01/01/21 0912 (!) 154/103     Pulse Rate 01/01/21 0912 77     Resp 01/01/21 0912 18     Temp 01/01/21 0912 98.5 F (36.9 C)     Temp Source 01/01/21 0912 Oral     SpO2 01/01/21 0912 99 %     Weight --      Height --      Head Circumference --      Peak Flow --      Pain Score 01/01/21 0849 4     Pain Loc --      Pain Edu? --      Excl. in [place]? --    Vitals:   01/01/21 0912  BP: (!) 154/103  Pulse: 77  Resp: 18  Temp: 98.5  F (36.9 C)  SpO2: 99%   Physical Exam Vitals and nursing note reviewed.  Constitutional:      General: She is not in acute distress.    Appearance: She is well-developed.  HENT:     Head: Normocephalic and atraumatic.     Right Ear: External ear normal.     Left Ear: External ear normal.     Nose: Nose normal.  Eyes:     Conjunctiva/sclera: Conjunctivae normal.  Cardiovascular:  Rate and Rhythm: Normal rate and regular rhythm.     Heart sounds: No murmur heard. Pulmonary:     Effort: Pulmonary effort is normal. No respiratory distress.     Breath sounds: Normal breath sounds.  Abdominal:     Palpations: Abdomen is soft.     Tenderness: There is no abdominal tenderness.  Musculoskeletal:     Cervical back: Neck supple.  Skin:    General: Skin is warm and dry.     Capillary Refill: Capillary refill takes less than 2 seconds.  Neurological:     Mental Status: She is alert and oriented to person, place, and time.  Psychiatric:        Mood and Affect: Mood normal.    Patient has some scattered circular maculopapular blanchable lesions over her lower back and upper extremities.  Some are little smaller and more like pustules although there is no fluid-filled vesicles or blisters.  No surrounding induration erythema warmth or tenderness.  Oropharynx and glucose are unremarkable. ____________________________________________   LABS (all labs ordered are listed, but only abnormal results are displayed)  Labs Reviewed  COMPREHENSIVE METABOLIC PANEL - Abnormal; Notable for the following components:      Result Value   AST 50 (*)    All other components within normal limits  RESP PANEL BY RT-PCR (FLU A&B, COVID) [id number]  RPR   ____________________________________________  EKG  ____________________________________________  RADIOLOGY  ED MD interpretation:  Official radiology report(s): No results  found.  ____________________________________________   PROCEDURES  Procedure(s) performed (including Critical Care):  Procedures   ____________________________________________   INITIAL IMPRESSION / ASSESSMENT AND PLAN / ED COURSE      Patient presents with above to history exam for assessment of slight cough, chills and some itchy red macular lesions over her chest back and arms.  She states all of her symptoms began yesterday.  She denies any other clear associated sick symptoms including any oral genital lesions.  Differential mood possible viral exanthem, there is mild deformity in the setting of allergic reaction or acute viral illness.  Patient does not appear septic and there is no evidence of deep space infection head or neck.  I have a low suspicion for SJS at this time.  There are no obvious allergic margins the patient has been exposed to abdomen she reports mild cough and chills and concern for possible acute viral process.  No abnormal breath sounds or fever to suggest bacterial pneumonia.  Patient endorses a history of autoimmune hepatitis LFTs are unremarkable today and there is no other significant electrolyte or metabolic derangements.  We will send an RPR as well as a COVID and influenza PCR.  Considered possible multiplex other no vesicular blistering lesions amenable to obtaining sample for testing today.  Given concern for possible viral illness we will hold off on steroids and I have a low suspicion for acute allergy at this time.  Advised patient she may take Claritin during the day when she does not wish to be sleepy and Benadryl in the evening before bed.  Advise close outpatient follow-up with me in 24 to 40 hours.  Advised to return immediately to emergency room for any worsening symptoms.  She is amenable to this plan.  Advised her to follow-up her RPR and COVID results with her PCP.  ____________________________________________   FINAL CLINICAL IMPRESSION(S) /  ED DIAGNOSES  Final diagnoses:  Urticaria  Bronchitis    Medications  loratadine (CLARITIN) tablet 10 mg (10 mg Oral  Given 01/01/21 1124)     ED Discharge Orders     None        Note:  This document was prepared using Dragon voice recognition software and may include unintentional dictation errors.    Lucrezia Starch, MD 01/01/21 [phone]

## 2021-01-01 NOTE — ED Triage Notes (Addendum)
Pt comes with c/o rash and some muscle spasms. Pt states rash over breast, arms, back, belly and legs.

## 2021-01-01 NOTE — ED Notes (Signed)
Says rash that itches since yesterday--on arms, legs, trunk, head.  Also says she feels short of breath with exertion.  Has  Had a cough.  Says she has no fever.  Works at [employer].

## 2021-01-02 LAB — RPR: RPR Ser Ql: NONREACTIVE

## 2021-03-08 ENCOUNTER — Emergency Department: Payer: Managed Care, Other (non HMO)

## 2021-03-08 ENCOUNTER — Encounter: Payer: Self-pay | Admitting: *Deleted

## 2021-03-08 ENCOUNTER — Emergency Department
Admission: EM | Admit: 2021-03-08 | Discharge: 2021-03-08 | Disposition: A | Discharge status: Home / Self Care | Payer: Managed Care, Other (non HMO) | Attending: Emergency Medicine | Admitting: Emergency Medicine

## 2021-03-08 ENCOUNTER — Other Ambulatory Visit: Payer: Self-pay

## 2021-03-08 DIAGNOSIS — R059 Cough, unspecified: Secondary | ICD-10-CM | POA: Diagnosis present

## 2021-03-08 DIAGNOSIS — J209 Acute bronchitis, unspecified: Secondary | ICD-10-CM | POA: Insufficient documentation

## 2021-03-08 DIAGNOSIS — F17210 Nicotine dependence, cigarettes, uncomplicated: Secondary | ICD-10-CM | POA: Insufficient documentation

## 2021-03-08 DIAGNOSIS — M62838 Other muscle spasm: Secondary | ICD-10-CM | POA: Diagnosis not present

## 2021-03-08 DIAGNOSIS — J45909 Unspecified asthma, uncomplicated: Secondary | ICD-10-CM | POA: Diagnosis not present

## 2021-03-08 DIAGNOSIS — R202 Paresthesia of skin: Secondary | ICD-10-CM | POA: Insufficient documentation

## 2021-03-08 LAB — GROUP A STREP BY PCR: Group A Strep by PCR: NOT DETECTED

## 2021-03-08 MED ORDER — FLUCONAZOLE 150 MG PO TABS: ORAL_TABLET | ORAL | 0 refills | Status: DC

## 2021-03-08 MED ORDER — BACLOFEN 10 MG PO TABS: 10.0000 mg | ORAL_TABLET | Freq: Three times a day (TID) | ORAL | 0 refills | Status: AC

## 2021-03-08 MED ORDER — AZITHROMYCIN 250 MG PO TABS: ORAL_TABLET | ORAL | 0 refills | Status: DC

## 2021-03-08 MED ORDER — BENZONATATE 100 MG PO CAPS: 100.0000 mg | ORAL_CAPSULE | Freq: Three times a day (TID) | ORAL | 0 refills | Status: AC | PRN

## 2021-03-08 NOTE — ED Provider Notes (Signed)
[facility] Emergency Department Provider Note  ____________________________________________   Event Date/Time   First MD Initiated Contact with Patient 03/08/21 0720     (approximate)  I have reviewed the triage vital signs and the nursing notes.   HISTORY  Chief Complaint Bronchitis    HPI MELVENA VINK is a 40 y.o. female [facility] emergency department complaining of continued cough and congestion.  States she was diagnosed with bronchitis last week.  Also has some left shoulder pain with tightness that creates numbness into the arm.  No chest pain or shortness of breath.  No fever or chills.  Past Medical History:  Diagnosis Date   Anemia    Asthma    Borderline personality disorder ([place])    Bronchitis unk   Chronic hepatitis ([place])    Generalized OA    Immune deficiency disorder (HCC)    chronic autoimmune hepatitis per pt   Kidney stone    Migraine     Patient Active Problem List   Diagnosis Date Noted   Iron deficiency anemia 10/03/2019   Allergic rhinitis 09/24/2019   Autoimmune hepatitis ([place]) 09/24/2019   GERD (gastroesophageal reflux disease) 09/24/2019   Hemorrhoid 09/24/2019   Migraine 09/24/2019   Nephrolithiasis 09/24/2019   Stress incontinence in female 09/24/2019   Suicide attempt ([place]) 09/24/2019   Adnexal mass    Left ovarian cyst 08/09/2019   Acute pyelonephritis 02/03/2018    Past Surgical History:  Procedure Laterality Date   ABDOMINAL HYSTERECTOMY     LAPAROSCOPIC UNILATERAL SALPINGO OOPHERECTOMY Left 08/15/2019   Procedure: LAPAROSCOPIC UNILATERAL SALPINGO OOPHORECTOMY;  Surgeon: Homero Fellers, MD;  Location: [facility] ORS;  Service: Gynecology;  Laterality: Left;   LITHOTRIPSY     OVARIAN CYST REMOVAL      Prior to Admission medications   Medication Sig Start Date End Date Taking? Authorizing Provider  azithromycin (ZITHROMAX Z-PAK) 250 MG tablet 2 pills today then 1 pill a day for 4 days 03/08/21  Yes  Lizvette Lightsey, Linden Dolin, PA-C  baclofen (LIORESAL) 10 MG tablet Take 1 tablet (10 mg total) by mouth 3 (three) times daily for 7 days. 03/08/21 03/15/21 Yes Evellyn Tuff, Linden Dolin, PA-C  benzonatate (TESSALON PERLES) 100 MG capsule Take 1 capsule (100 mg total) by mouth 3 (three) times daily as needed for cough. 03/08/21 03/08/22 Yes Race Latour, Linden Dolin, PA-C  fluconazole (DIFLUCAN) 150 MG tablet Take one now and one in a week 03/08/21  Yes Chayanne Speir, Linden Dolin, PA-C  albuterol (VENTOLIN HFA) 108 (90 Base) MCG/ACT inhaler Inhale into the lungs every 6 (six) hours as needed for wheezing or shortness of breath.    [provider]  ALPRAZolam Duanne Moron) 1 MG tablet Take 1 mg by mouth at bedtime as needed for anxiety.    [provider]  famotidine (PEPCID) 20 MG tablet Take 1 tablet (20 mg total) by mouth 2 (two) times daily. 03/19/20   Marlana Salvage, PA  naproxen (NAPROSYN) 500 MG tablet Take 1 tablet (500 mg total) by mouth 2 (two) times daily with a meal. 03/19/20   Rodgers, Farrel Gordon, PA    Allergies Haldol [haloperidol lactate]  Family History  Adopted: Yes    Social History Social History   Tobacco Use   Smoking status: Some Days    Packs/day: 1.00    Types: Cigarettes   Smokeless tobacco: Never   Tobacco comments:    1 cigarette per day   Vaping Use   Vaping Use: Never used  Substance Use Topics   Alcohol use: No   Drug use: Never    Review of Systems  Constitutional: No fever/chills Eyes: No visual changes. ENT: Positive sore throat. Respiratory: Positive cough Cardiovascular: Denies chest pain Gastrointestinal: Denies abdominal pain Genitourinary: Negative for dysuria. Musculoskeletal: Negative for back pain. Skin: Negative for rash. Psychiatric: no mood changes,     ____________________________________________   PHYSICAL EXAM:  VITAL SIGNS: ED Triage Vitals  Enc Vitals Group     BP 03/08/21 0204 136/74     Pulse Rate 03/08/21 0204 94     Resp 03/08/21 0204  16     Temp 03/08/21 0204 98.7 F (37.1 C)     Temp Source 03/08/21 0204 Oral     SpO2 03/08/21 0204 97 %     Weight 03/08/21 0718 190 lb 0.6 oz (86.2 kg)     Height 03/08/21 0718 5\' 2"  (1.575 m)     Head Circumference --      Peak Flow --      Pain Score 03/08/21 0205 6     Pain Loc --      Pain Edu? --      Excl. in [place]? --     Constitutional: Alert and oriented. Well appearing and in no acute distress. Eyes: Conjunctivae are normal.  Head: Atraumatic. Nose: No congestion/rhinnorhea. Mouth/Throat: Mucous membranes are moist.  Throat appears normal, white patch noted on the roof of the mouth, question yeast Neck:  supple no lymphadenopathy noted Cardiovascular: Normal rate, regular rhythm. Heart sounds are normal Respiratory: Normal respiratory effort.  No retractions, lungs c t a  GU: deferred Musculoskeletal: FROM all extremities, warm and well perfused Neurologic:  Normal speech and language.  Skin:  Skin is warm, dry and intact. No rash noted. Psychiatric: Mood and affect are normal. Speech and behavior are normal.  ____________________________________________   LABS (all labs ordered are listed, but only abnormal results are displayed)  Labs Reviewed  GROUP A STREP BY PCR   ____________________________________________   ____________________________________________  RADIOLOGY  Chest x-ray  ____________________________________________   PROCEDURES  Procedure(s) performed: EKG shows normal sinus rhythm, see physician read  Procedures    ____________________________________________   INITIAL IMPRESSION / ASSESSMENT AND PLAN / ED COURSE  Pertinent labs & imaging results that were available during my care of the patient were reviewed by me and considered in my medical decision making (see chart for details).   The patient is a 40 year old female presents emergency department with continued URI symptoms.  See HPI.  Physical exam shows patient per  stable.  Dx: Covid, influenza, CAP, strep throat, muscle spasm in the left shoulder  Strep test negative, Chest x-ray reviewed by me confirmed by radiology to be negative  EKG showed normal sinus rhythm, see physician read  I did explain all the findings to the patient.  She was placed on Zithromax, baclofen, Tessalon Perles, and Diflucan.  She is to follow-up with her regular doctor if not improving to 3 days.  Return emergency department worsening.  She is in agreement treatment plan.  Was discharged stable condition.     DANIQUE HARTSOUGH was evaluated in Emergency Department on 03/08/2021 for the symptoms described in the history of present illness. She was evaluated in the context of the global COVID-19 pandemic, which necessitated consideration that the patient might be at risk for infection with the SARS-CoV-2 virus that causes COVID-19. Institutional protocols and algorithms that pertain to the evaluation of patients at risk for  COVID-19 are in a state of rapid change based on information released by regulatory bodies including the CDC and federal and state organizations. These policies and algorithms were followed during the patient's care in the ED.    As part of my medical decision making, I reviewed the following data within the [facility] notes reviewed and incorporated, Labs reviewed , EKG interpreted NSR, Old chart reviewed, Radiograph reviewed , Notes from prior ED visits, and [place] Controlled Substance Database  ____________________________________________   FINAL CLINICAL IMPRESSION(S) / ED DIAGNOSES  Final diagnoses:  Acute bronchitis, unspecified organism  Muscle spasm of left shoulder      NEW MEDICATIONS STARTED DURING THIS VISIT:  New Prescriptions   AZITHROMYCIN (ZITHROMAX Z-PAK) 250 MG TABLET    2 pills today then 1 pill a day for 4 days   BACLOFEN (LIORESAL) 10 MG TABLET    Take 1 tablet (10 mg total) by mouth 3 (three) times daily  for 7 days.   BENZONATATE (TESSALON PERLES) 100 MG CAPSULE    Take 1 capsule (100 mg total) by mouth 3 (three) times daily as needed for cough.   FLUCONAZOLE (DIFLUCAN) 150 MG TABLET    Take one now and one in a week     Note:  This document was prepared using Dragon voice recognition software and may include unintentional dictation errors.    Versie Starks, PA-C 03/08/21 1458    Lucrezia Starch, MD 03/08/21 [phone]

## 2021-03-08 NOTE — Discharge Instructions (Addendum)
Follow up with your reglar doctor if not improving in 3 days Return if worsening

## 2021-03-08 NOTE — ED Triage Notes (Addendum)
Pt to ED reporting she was dx with Bronchitis a week ago. Pt returning to ED with worsening symptoms and left shoulder and arm pain and "tightness" with numbness and a sore throat.

## 2021-03-08 NOTE — ED Notes (Signed)
See triage note  presents with sore throat  recently dx'd with bronchitis  but also having some pain from neck into arm  afebrile on arrival

## 2021-04-15 IMAGING — US US BIOPSY CORE LIVER
1 series · 13 of 24 positions shown · non-contrast
Comparison: none

INDICATION: 38-year-old with history of autoimmune hepatitis and elevated liver
enzymes. Request for liver biopsy.

[Series 1: us biopsy core liver · 0.19mm/px · 13 of 24 slices shown]
[im 1/24]
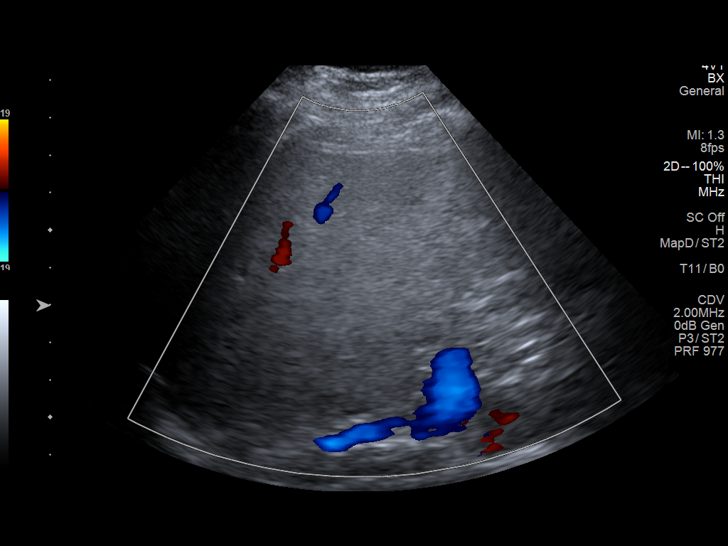
[im 3/24]
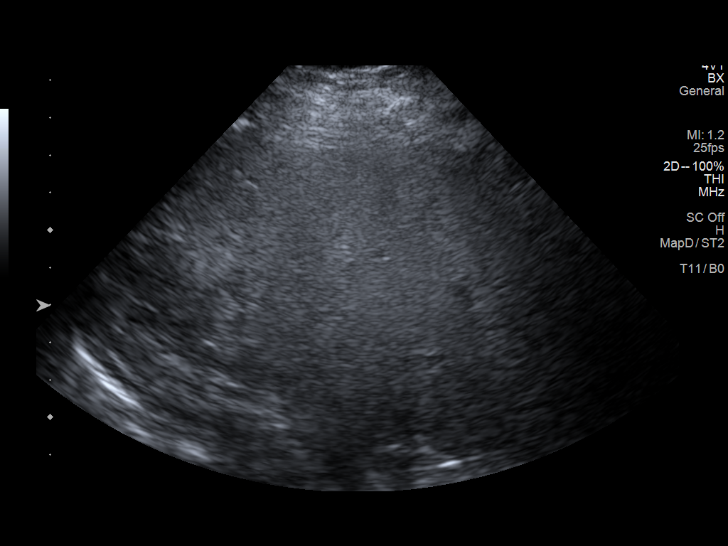
[im 5/24]
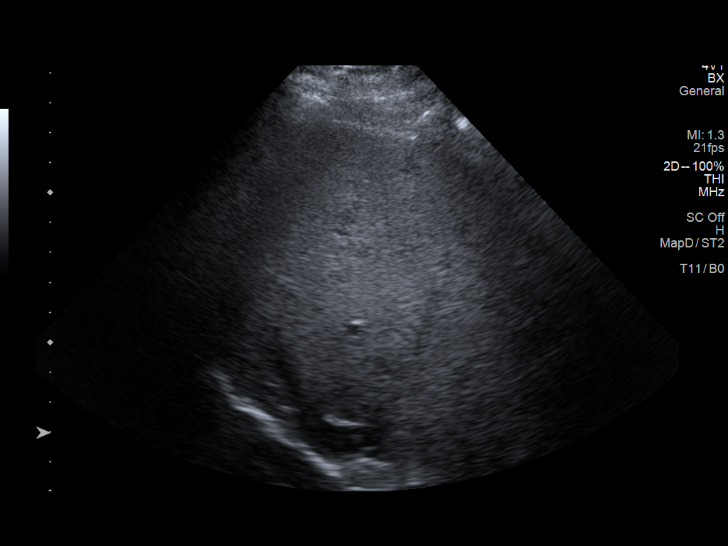
[im 7/24]
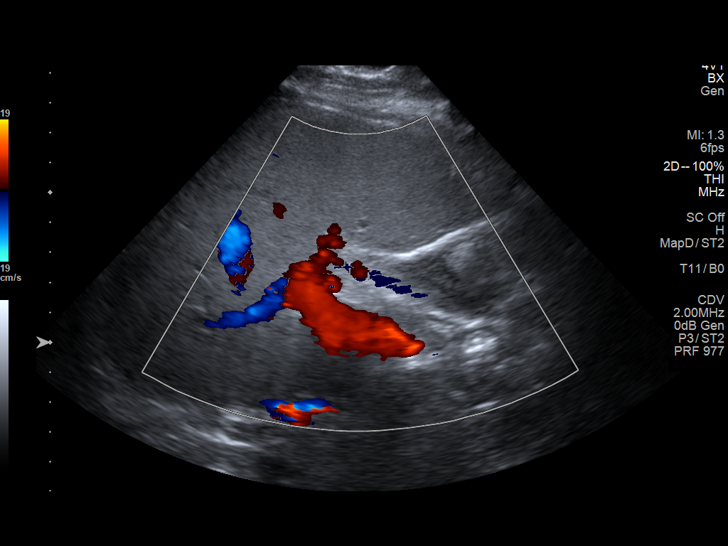
[im 9/24]
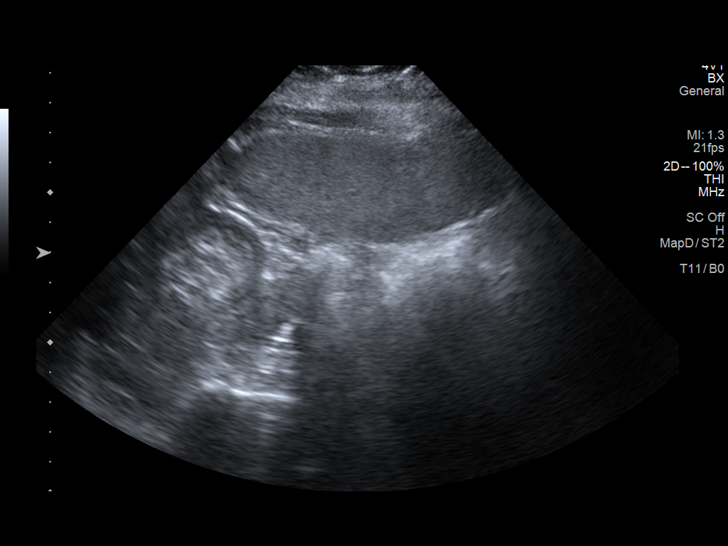
[im 11/24]
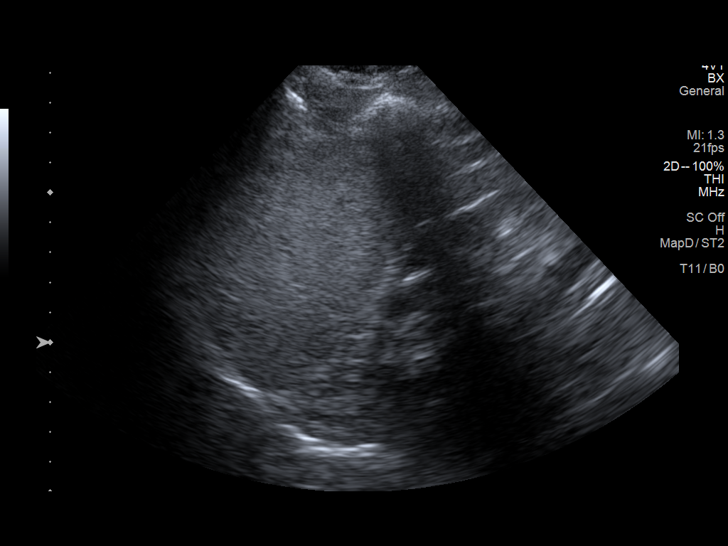
[im 13/24]
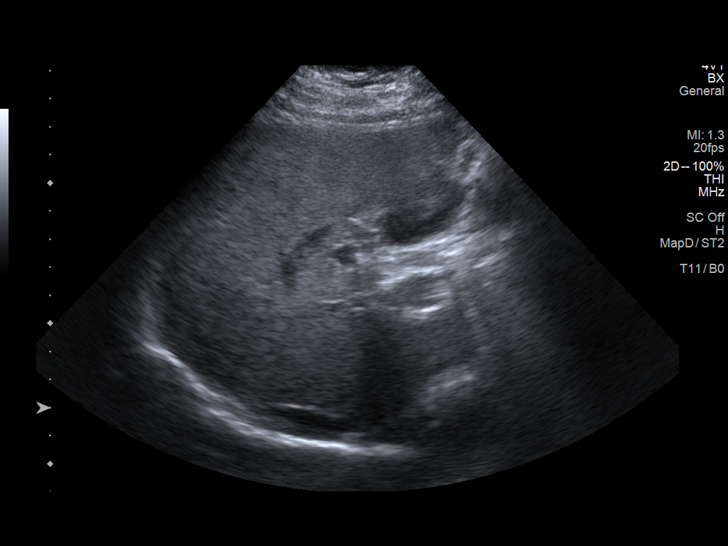
[im 14/24]
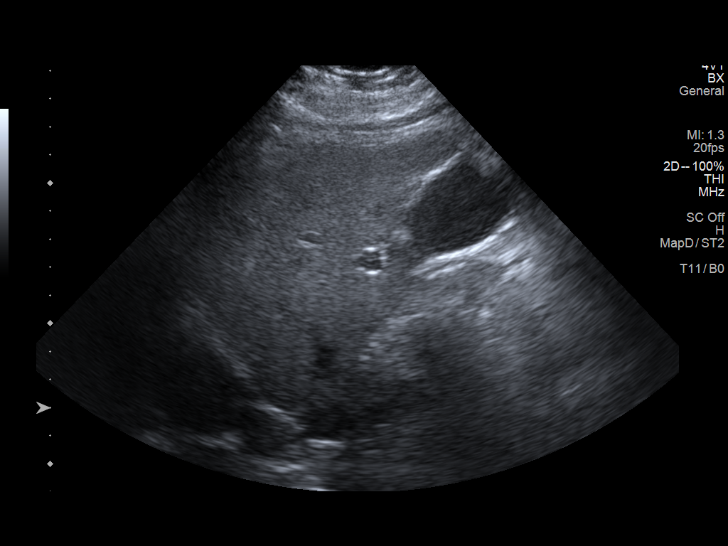
[im 16/24]
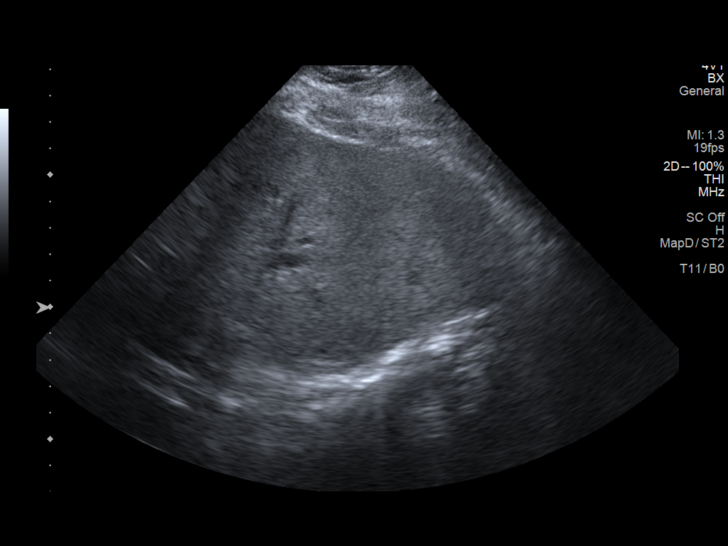
[im 18/24]
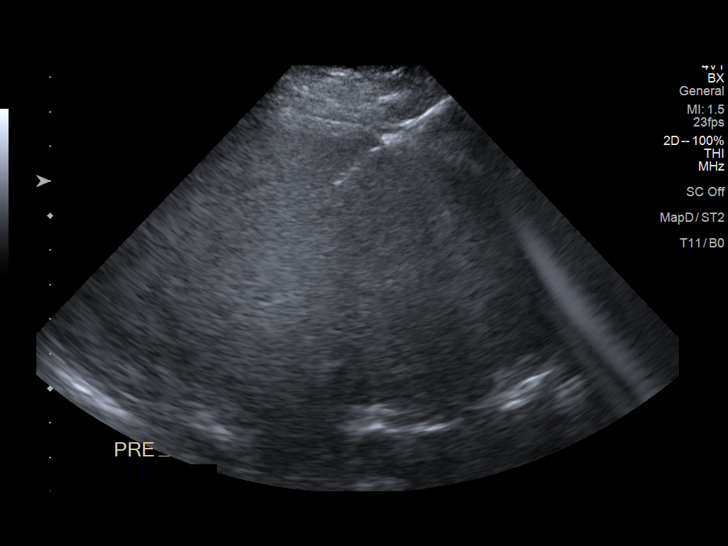
[im 20/24]
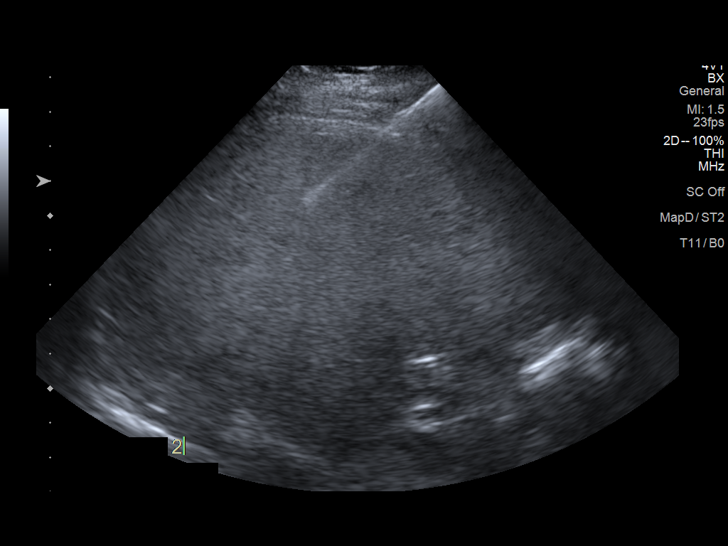
[im 22/24]
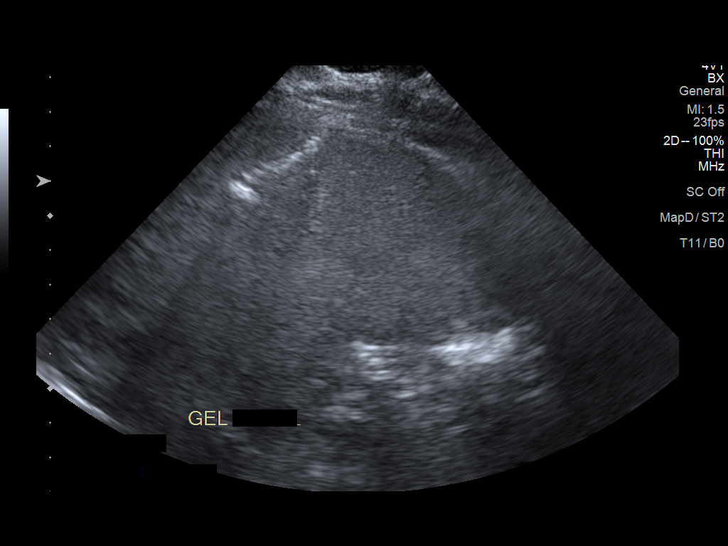
[im 24/24]
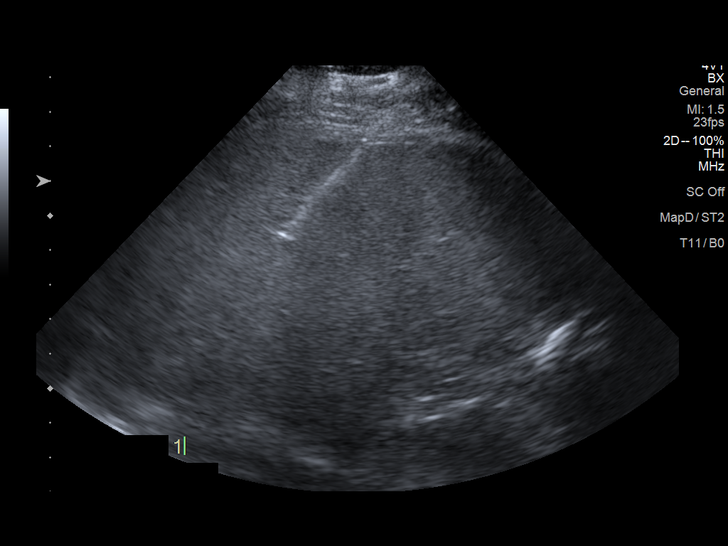

[13 of 24 positions shown; findings below may reference images not displayed]

EXAM:
ULTRASOUND-GUIDED LIVER BIOPSY

MEDICATIONS:
None.

ANESTHESIA/SEDATION:
Moderate (conscious) sedation was employed during this procedure. A
total of Versed 1.0 mg and Fentanyl 75 mcg was administered
intravenously.

Moderate Sedation Time: 20 minutes. The patient's level of
consciousness and vital signs were monitored continuously by
radiology nursing throughout the procedure under my direct
supervision.

FLUOROSCOPY TIME:  None

COMPLICATIONS:
None immediate.

PROCEDURE:
Informed written consent was obtained from the patient after a
thorough discussion of the procedural risks, benefits and
alternatives. All questions were addressed. A timeout was performed
prior to the initiation of the procedure.

Ultrasound was used to evaluate the liver. The right side of the
abdomen was targeted for biopsy. The right side of the abdomen was
prepped with chlorhexidine and sterile field was created. Skin was
anesthetized using 1% lidocaine. Small incision was made. Using
ultrasound guidance, 17 gauge coaxial needle was directed into the
right hepatic lobe. Total of 3 core biopsies were obtained using a
18 gauge core device. Specimens placed in formalin. Gel-Foam slurry
was injected through the 17 gauge needle as it was removed. Bandage
placed over the puncture site.
FINDINGS: Liver is slightly echogenic and heterogeneous. Core biopsies
obtained from the right hepatic lobe. Three adequate specimens
obtained. No immediate bleeding or hematoma formation.
IMPRESSION: Ultrasound-guided core biopsies of the right hepatic lobe.

## 2021-05-15 ENCOUNTER — Other Ambulatory Visit: Payer: Self-pay

## 2021-05-15 ENCOUNTER — Emergency Department
Admission: EM | Admit: 2021-05-15 | Discharge: 2021-05-15 | Disposition: A | Discharge status: Home / Self Care | Payer: Managed Care, Other (non HMO) | Attending: Emergency Medicine | Admitting: Emergency Medicine

## 2021-05-15 ENCOUNTER — Encounter: Payer: Self-pay | Admitting: Emergency Medicine

## 2021-05-15 DIAGNOSIS — F17210 Nicotine dependence, cigarettes, uncomplicated: Secondary | ICD-10-CM | POA: Insufficient documentation

## 2021-05-15 DIAGNOSIS — J45909 Unspecified asthma, uncomplicated: Secondary | ICD-10-CM | POA: Diagnosis not present

## 2021-05-15 DIAGNOSIS — J069 Acute upper respiratory infection, unspecified: Secondary | ICD-10-CM

## 2021-05-15 DIAGNOSIS — U071 COVID-19: Secondary | ICD-10-CM | POA: Diagnosis not present

## 2021-05-15 DIAGNOSIS — R519 Headache, unspecified: Secondary | ICD-10-CM | POA: Diagnosis present

## 2021-05-15 MED ORDER — IBUPROFEN 600 MG PO TABS
600.0000 mg | ORAL_TABLET | Freq: Once | ORAL | Status: AC
  Administered 2021-05-15: 600 mg via ORAL
  Filled 2021-05-15: qty 1

## 2021-05-15 MED ORDER — IBUPROFEN 800 MG PO TABS
800.0000 mg | ORAL_TABLET | Freq: Three times a day (TID) | ORAL | 0 refills | Status: DC | PRN
Start: 2021-05-15 — End: 2023-02-28

## 2021-05-15 MED ORDER — BENZONATATE 100 MG PO CAPS
200.0000 mg | ORAL_CAPSULE | Freq: Once | ORAL | Status: AC
  Administered 2021-05-15: 200 mg via ORAL
  Filled 2021-05-15: qty 2

## 2021-05-15 MED ORDER — PSEUDOEPH-BROMPHEN-DM 30-2-10 MG/5ML PO SYRP: 5.0000 mL | ORAL_SOLUTION | Freq: Four times a day (QID) | ORAL | 0 refills | Status: DC | PRN

## 2021-05-15 NOTE — ED Triage Notes (Signed)
Pt via POV from home. Pt's son is COVID + and she has been having cough, congestion, headache for the 3-4 days. Pt is A&OX4 and NAD.

## 2021-05-15 NOTE — ED Notes (Signed)
Pt. Discharged prior to RN assessment. 

## 2021-05-15 NOTE — Discharge Instructions (Signed)
You may find results of your COVID-19 test results in the MyChart app.  Read and follow discharge care instructions and take medication as directed

## 2021-05-15 NOTE — ED Provider Notes (Signed)
[facility] Emergency Department Provider Note   ____________________________________________   Event Date/Time   First MD Initiated Contact with Patient 05/15/21 1714     (approximate)  I have reviewed the triage vital signs and the nursing notes.   HISTORY  Chief Complaint Headache, Nasal Congestion, and Cough    HPI Abigail Cunningham is a 40 y.o. female patient presents with cough, chest congestion, and headache for 3 to 4 days.  Patient is here for the same complaint.  Patient has been exposed to COVID-19 from her son.  Patient states taken two Covid shots . but has not taken  boosters.  The headaches associated with coughing spells.  Denies nausea, vomiting, diarrhea, loss of taste/smell.         Past Medical History:  Diagnosis Date   Anemia    Asthma    Borderline personality disorder ([place])    Bronchitis unk   Chronic hepatitis ([place])    Generalized OA    Immune deficiency disorder (HCC)    chronic autoimmune hepatitis per pt   Kidney stone    Migraine     Patient Active Problem List   Diagnosis Date Noted   Iron deficiency anemia 10/03/2019   Allergic rhinitis 09/24/2019   Autoimmune hepatitis ([place]) 09/24/2019   GERD (gastroesophageal reflux disease) 09/24/2019   Hemorrhoid 09/24/2019   Migraine 09/24/2019   Nephrolithiasis 09/24/2019   Stress incontinence in female 09/24/2019   Suicide attempt ([place]) 09/24/2019   Adnexal mass    Left ovarian cyst 08/09/2019   Acute pyelonephritis 02/03/2018    Past Surgical History:  Procedure Laterality Date   ABDOMINAL HYSTERECTOMY     LAPAROSCOPIC UNILATERAL SALPINGO OOPHERECTOMY Left 08/15/2019   Procedure: LAPAROSCOPIC UNILATERAL SALPINGO OOPHORECTOMY;  Surgeon: Homero Fellers, MD;  Location: [facility] ORS;  Service: Gynecology;  Laterality: Left;   LITHOTRIPSY     OVARIAN CYST REMOVAL      Prior to Admission medications   Medication Sig Start Date End Date Taking? Authorizing  Provider  brompheniramine-pseudoephedrine-DM 30-2-10 MG/5ML syrup Take 5 mLs by mouth 4 (four) times daily as needed. 05/15/21  Yes Sable Feil, PA-C  ibuprofen (ADVIL) 800 MG tablet Take 1 tablet (800 mg total) by mouth every 8 (eight) hours as needed for moderate pain. 05/15/21  Yes Sable Feil, PA-C  albuterol (VENTOLIN HFA) 108 (90 Base) MCG/ACT inhaler Inhale into the lungs every 6 (six) hours as needed for wheezing or shortness of breath.    [provider]  ALPRAZolam Duanne Moron) 1 MG tablet Take 1 mg by mouth at bedtime as needed for anxiety.    [provider]  azithromycin (ZITHROMAX Z-PAK) 250 MG tablet 2 pills today then 1 pill a day for 4 days 03/08/21   Caryn Section Linden Dolin, PA-C  benzonatate (TESSALON PERLES) 100 MG capsule Take 1 capsule (100 mg total) by mouth 3 (three) times daily as needed for cough. 03/08/21 03/08/22  Fisher, Linden Dolin, PA-C  famotidine (PEPCID) 20 MG tablet Take 1 tablet (20 mg total) by mouth 2 (two) times daily. 03/19/20   Marlana Salvage, PA  fluconazole (DIFLUCAN) 150 MG tablet Take one now and one in a week 03/08/21   Caryn Section, Linden Dolin, PA-C  naproxen (NAPROSYN) 500 MG tablet Take 1 tablet (500 mg total) by mouth 2 (two) times daily with a meal. 03/19/20   Rodgers, Farrel Gordon, PA    Allergies Haldol [haloperidol lactate]  Family History  Adopted: Yes  Social History Social History   Tobacco Use   Smoking status: Some Days    Packs/day: 1.00    Types: Cigarettes   Smokeless tobacco: Never   Tobacco comments:    1 cigarette per day   Vaping Use   Vaping Use: Never used  Substance Use Topics   Alcohol use: No   Drug use: Never    Review of Systems Constitutional: No fever/chills Eyes: No visual changes. ENT: No sore throat. Cardiovascular: Denies chest pain. Respiratory: Denies shortness of breath.  Nonproductive cough. Gastrointestinal: No abdominal pain.  No nausea, no vomiting.  No diarrhea.  No  constipation. Genitourinary: Negative for dysuria. Musculoskeletal: Negative for back pain. Skin: Negative for rash. Neurological: Positive for headaches, but denies focal weakness or numbness. Allergic/Immunilogical: Haldol ____________________________________________   PHYSICAL EXAM:  VITAL SIGNS: ED Triage Vitals [05/15/21 1656]  Enc Vitals Group     BP (!) 131/98     Pulse Rate 84     Resp 20     Temp 99 F (37.2 C)     Temp Source Oral     SpO2 97 %     Weight 180 lb (81.6 kg)     Height 5\' 3"  (1.6 m)     Head Circumference      Peak Flow      Pain Score 0     Pain Loc      Pain Edu?      Excl. in [place]?    Constitutional: Alert and oriented. Well appearing and in no acute distress. Eyes: Conjunctivae are normal. PERRL. EOMI. Head: Atraumatic. Nose: No congestion/rhinnorhea. Mouth/Throat: Mucous membranes are moist.  Oropharynx non-erythematous. Neck: No stridor.  No cervical spine tenderness to palpation. Hematological/Lymphatic/Immunilogical: No cervical lymphadenopathy. Cardiovascular: Normal rate, regular rhythm. Grossly normal heart sounds.  Good peripheral circulation. Respiratory: Normal respiratory effort.  No retractions. Lungs CTAB. Gastrointestinal: Soft and nontender. No distention. No abdominal bruits. No CVA tenderness. Genitourinary: Deferred Musculoskeletal: No lower extremity tenderness nor edema.  No joint effusions. Neurologic:  Normal speech and language. No gross focal neurologic deficits are appreciated. No gait instability. Skin:  Skin is warm, dry and intact. No rash noted. Psychiatric: Mood and affect are normal. Speech and behavior are normal.  ____________________________________________   LABS (all labs ordered are listed, but only abnormal results are displayed)  Labs Reviewed  RESP PANEL BY RT-PCR (FLU A&B, COVID) [id number]    ____________________________________________  EKG   ____________________________________________  RADIOLOGY I, Sable Feil, personally viewed and evaluated these images (plain radiographs) as part of my medical decision making, as well as reviewing the written report by the radiologist.  ED MD interpretation:    Official radiology report(s): No results found.  ____________________________________________   PROCEDURES  Procedure(s) performed (including Critical Care):  Procedures   ____________________________________________   INITIAL IMPRESSION / ASSESSMENT AND PLAN / ED COURSE  As part of my medical decision making, I reviewed the following data within the [facility]   Patient presents with cough, nasal and chest congestion, headache for 3 to 4 days.  Patient states son tested positive for COVID-19.  Patient given discharge care instruction viral illness.  Patient advised to follow-up COVID 19 test results in the MyChart app.          ____________________________________________   FINAL CLINICAL IMPRESSION(S) / ED DIAGNOSES  Final diagnoses:  Viral URI with cough     ED Discharge Orders  Ordered    brompheniramine-pseudoephedrine-DM 30-2-10 MG/5ML syrup  4 times daily PRN        05/15/21 1929    ibuprofen (ADVIL) 800 MG tablet  Every 8 hours PRN        05/15/21 1929             Note:  This document was prepared using Dragon voice recognition software and may include unintentional dictation errors.    Sable Feil, PA-C 05/15/21 1944    Delman Kitten, MD 05/16/21 2330

## 2021-05-16 LAB — RESP PANEL BY RT-PCR (FLU A&B, COVID) ARPGX2
Influenza A by PCR: NEGATIVE
Influenza B by PCR: NEGATIVE
SARS Coronavirus 2 by RT PCR: POSITIVE — AB

## 2021-08-03 IMAGING — CR DG CHEST 2V
1 series · 2 of 2 positions shown · non-contrast
Comparison: 01/07/2013.

CLINICAL DATA: Shortness of breath.

EXAM:
CHEST - 2 VIEW

[Series 1: dg chest 2 view · 0.14mm/px · 2 of 2 slices shown]
[im 1/2]
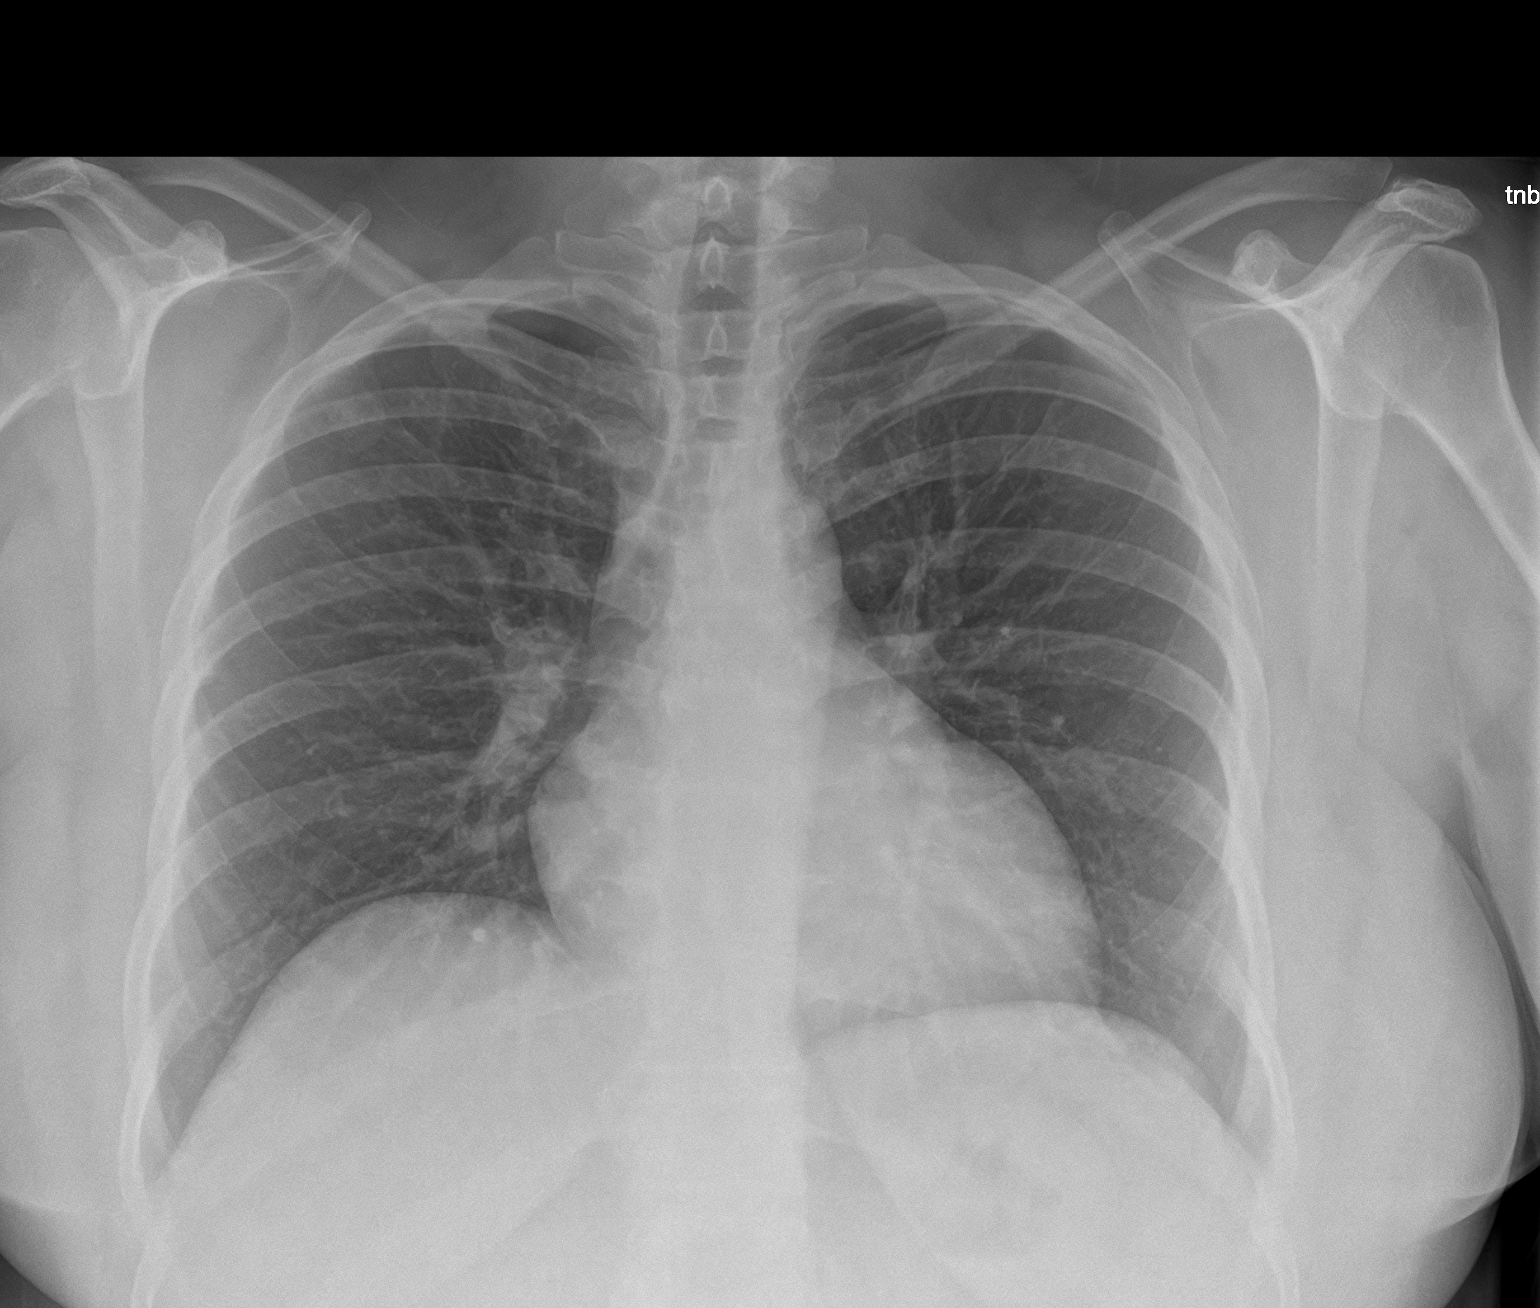
[im 2/2]
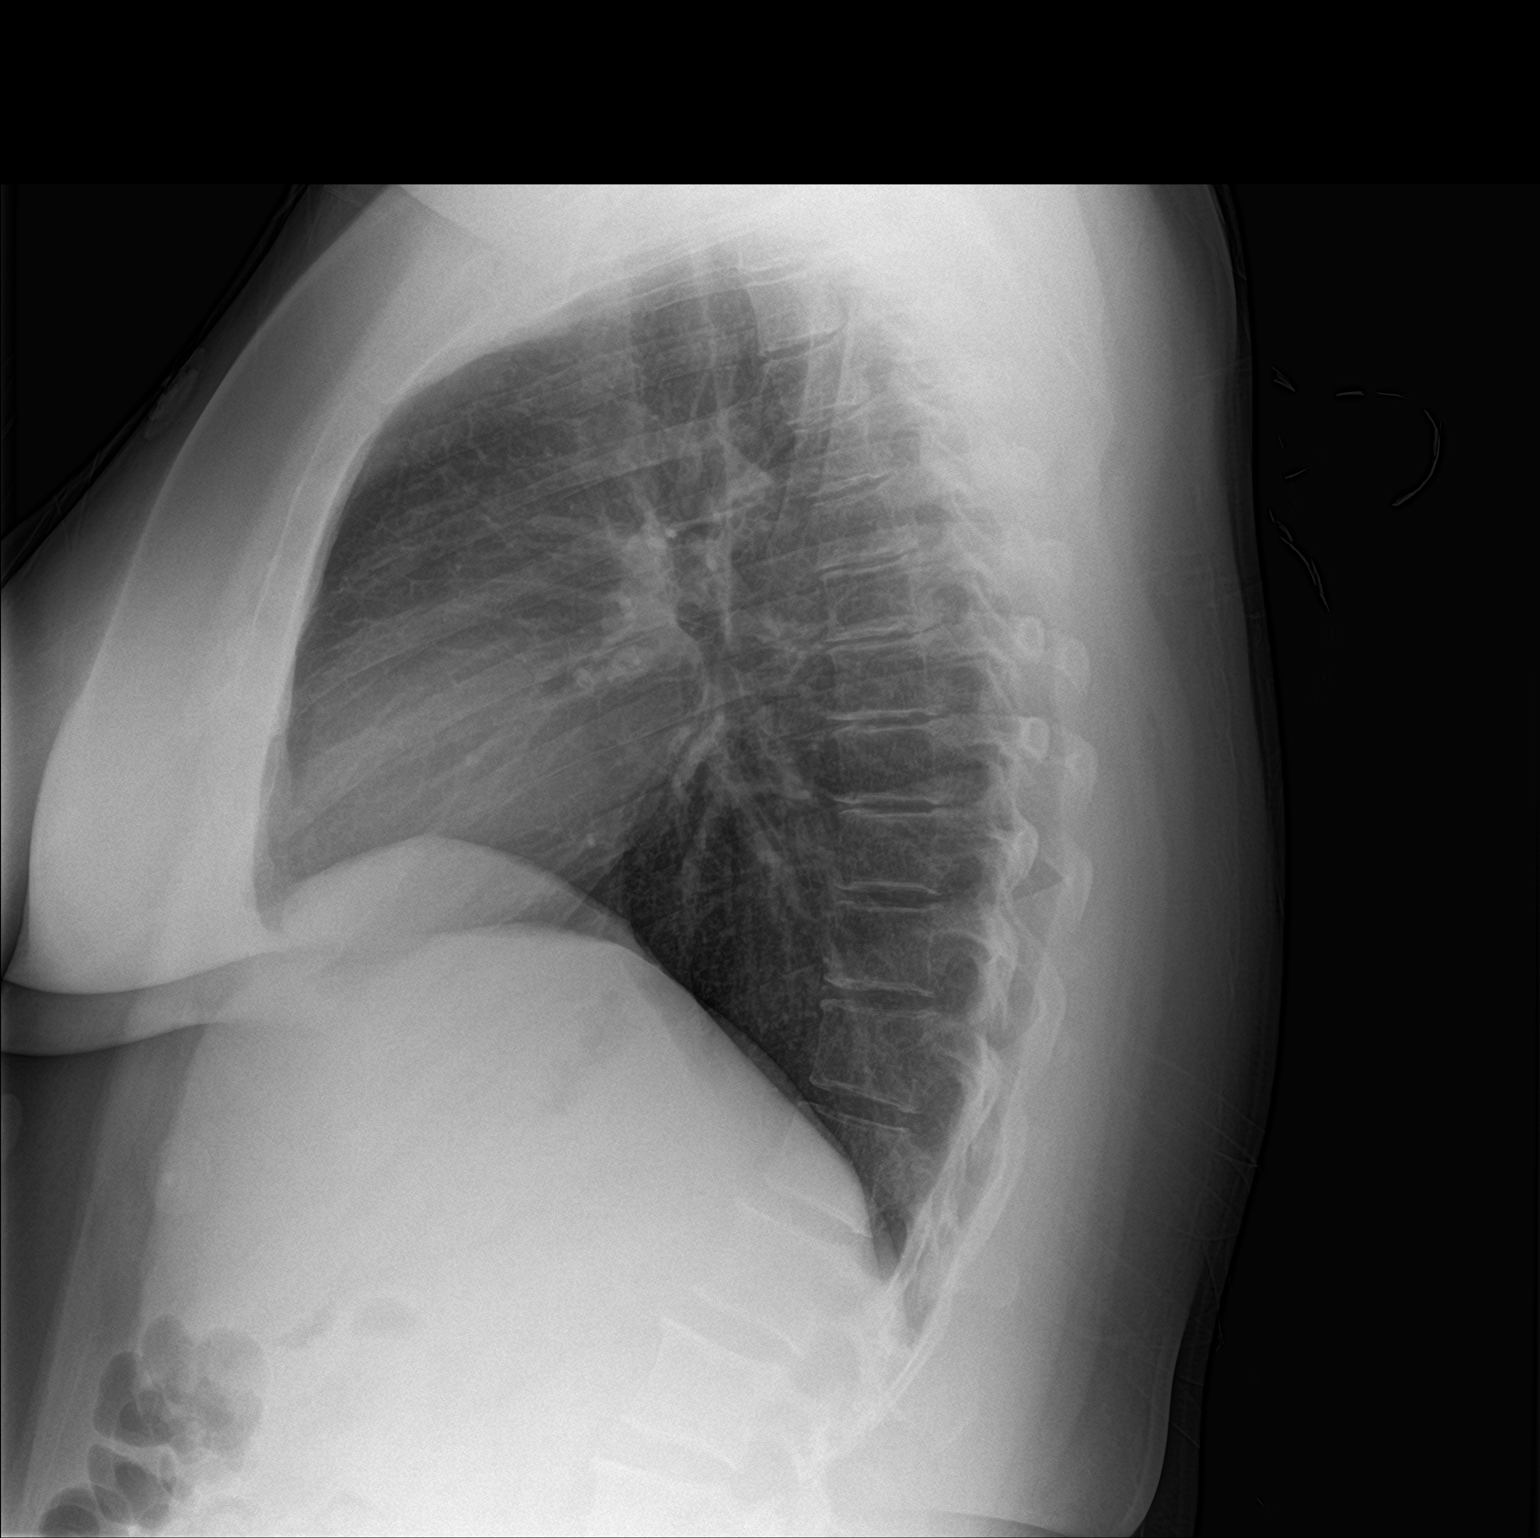

[2 of 2 positions shown; findings below may reference images not displayed]

FINDINGS: Mediastinum and hilar structures normal. Lungs are clear. No pleural
effusion or pneumothorax. Heart size normal. Degenerative changes
scoliosis thoracic spine.
IMPRESSION: No acute cardiopulmonary disease.

## 2022-02-17 ENCOUNTER — Encounter: Payer: Self-pay | Admitting: Oncology

## 2022-02-17 ENCOUNTER — Other Ambulatory Visit: Payer: Self-pay

## 2022-02-17 DIAGNOSIS — R202 Paresthesia of skin: Secondary | ICD-10-CM | POA: Insufficient documentation

## 2022-02-17 DIAGNOSIS — R519 Headache, unspecified: Secondary | ICD-10-CM | POA: Insufficient documentation

## 2022-02-17 DIAGNOSIS — M791 Myalgia, unspecified site: Secondary | ICD-10-CM | POA: Insufficient documentation

## 2022-02-17 LAB — POC URINE PREG, ED: Preg Test, Ur: NEGATIVE

## 2022-02-17 NOTE — ED Triage Notes (Signed)
Pt arrived via POV with reports of migraine x 1 week to the L side, pt reports the L side of her body aches as well x 1 week and is having muscle spasms in L leg and tingling to L arm.  Pt does not currently take any migraine meds, pt c/o nausea and light sensitivity and noise sensitivity.

## 2022-02-17 NOTE — ED Notes (Signed)
Rainbow sent to the lab at this time.  

## 2022-02-18 ENCOUNTER — Emergency Department
Admission: EM | Admit: 2022-02-18 | Discharge: 2022-02-18 | Disposition: A | Discharge status: Home / Self Care | Payer: Managed Care, Other (non HMO) | Attending: Emergency Medicine | Admitting: Emergency Medicine

## 2022-02-18 ENCOUNTER — Encounter: Payer: Self-pay | Admitting: Oncology

## 2022-02-18 DIAGNOSIS — R519 Headache, unspecified: Secondary | ICD-10-CM

## 2022-02-18 LAB — URINALYSIS, ROUTINE W REFLEX MICROSCOPIC
Bilirubin Urine: NEGATIVE
Glucose, UA: NEGATIVE mg/dL
Ketones, ur: NEGATIVE mg/dL
Nitrite: NEGATIVE
Protein, ur: NEGATIVE mg/dL
Specific Gravity, Urine: 1.016 (ref 1.005–1.030)
pH: 6 (ref 5.0–8.0)

## 2022-02-18 LAB — TROPONIN I (HIGH SENSITIVITY): Troponin I (High Sensitivity): 4 ng/L (ref <18)

## 2022-02-18 LAB — CBC
HCT: 33.4 % — ABNORMAL LOW (ref 36.0–46.0)
Hemoglobin: 9.8 g/dL — ABNORMAL LOW (ref 12.0–15.0)
MCH: 22.1 pg — ABNORMAL LOW (ref 26.0–34.0)
MCHC: 29.3 g/dL — ABNORMAL LOW (ref 30.0–36.0)
MCV: 75.2 fL — ABNORMAL LOW (ref 80.0–100.0)
Platelets: 190 10*3/uL (ref 150–400)
RBC: 4.44 MIL/uL (ref 3.87–5.11)
RDW: 15.3 % (ref 11.5–15.5)
WBC: 7.2 10*3/uL (ref 4.0–10.5)
nRBC: 0 % (ref 0.0–0.2)

## 2022-02-18 LAB — BASIC METABOLIC PANEL
Anion gap: 4 — ABNORMAL LOW (ref 5–15)
BUN: 9 mg/dL (ref 6–20)
CO2: 26 mmol/L (ref 22–32)
Calcium: 9.1 mg/dL (ref 8.9–10.3)
Chloride: 108 mmol/L (ref 98–111)
Creatinine, Ser: 0.74 mg/dL (ref 0.44–1.00)
GFR, Estimated: >60 mL/min (ref >60)
Glucose, Bld: 105 mg/dL — ABNORMAL HIGH (ref 70–99)
Potassium: 3.7 mmol/L (ref 3.5–5.1)
Sodium: 138 mmol/L (ref 135–145)

## 2022-02-18 LAB — CK: Total CK: 74 U/L (ref 38–234)

## 2022-02-18 MED ORDER — LACTATED RINGERS IV BOLUS
1000.0000 mL | Freq: Once | INTRAVENOUS | Status: AC
  Administered 2022-02-18: 1000 mL via INTRAVENOUS

## 2022-02-18 MED ORDER — KETOROLAC TROMETHAMINE 30 MG/ML IJ SOLN
15.0000 mg | Freq: Once | INTRAMUSCULAR | Status: AC
  Administered 2022-02-18: 15 mg via INTRAVENOUS
  Filled 2022-02-18: qty 1

## 2022-02-18 MED ORDER — ACETAMINOPHEN 500 MG PO TABS
1000.0000 mg | ORAL_TABLET | Freq: Once | ORAL | Status: AC
  Administered 2022-02-18: 1000 mg via ORAL
  Filled 2022-02-18: qty 2

## 2022-02-18 MED ORDER — DROPERIDOL 2.5 MG/ML IJ SOLN
2.5000 mg | Freq: Once | INTRAMUSCULAR | Status: AC
  Administered 2022-02-18: 2.5 mg via INTRAVENOUS
  Filled 2022-02-18: qty 2

## 2022-02-18 NOTE — ED Provider Notes (Signed)
[facility] Provider Note    Event Date/Time   First MD Initiated Contact with Patient 02/18/22 0145     (approximate)   History   Migraine and Generalized Body Aches   HPI  Abigail Cunningham is a 41 y.o. female who presents to the ED for evaluation of Migraine and Generalized Body Aches   I reviewed PCP visit from December.  History of obesity, migraines, GERD and anxiety.  Patient presents to the ED for evaluation of 1 week of left-sided paresthesias and migrainous headache.  Reports that headache that she could not abort at home with paresthesias and pain throughout the entire left side of her body.  No syncope or falls, fever or injuries.  No vision changes, gait disturbance or difficulty with fine motor tasks such as using her phone.   Physical Exam   Triage Vital Signs: ED Triage Vitals  Enc Vitals Group     BP 02/17/22 2319 134/85     Pulse Rate 02/17/22 2319 85     Resp 02/17/22 2319 18     Temp 02/17/22 2319 98.6 F (37 C)     Temp Source 02/17/22 2319 Oral     SpO2 02/17/22 2319 99 %     Weight 02/17/22 2320 185 lb (83.9 kg)     Height 02/17/22 2320 '5\' 2"'$  (1.575 m)     Head Circumference --      Peak Flow --      Pain Score 02/17/22 2341 3     Pain Loc --      Pain Edu? --      Excl. in [place]? --     Most recent vital signs: Vitals:   02/17/22 2319  BP: 134/85  Pulse: 85  Resp: 18  Temp: 98.6 F (37 C)  SpO2: 99%    General: Awake, no distress.  Ambulatory with normal gait.  Playing on her phone CV:  Good peripheral perfusion.  Resp:  Normal effort.  Abd:  No distention.  MSK:  No deformity noted.  Neuro:  No focal deficits appreciated. Cranial nerves II through XII intact 5/5 strength and sensation in all 4 extremities Other:     ED Results / Procedures / Treatments   Labs (all labs ordered are listed, but only abnormal results are displayed) Labs Reviewed  BASIC METABOLIC PANEL - Abnormal; Notable for the  following components:      Result Value   Glucose, Bld 105 (*)    Anion gap 4 (*)    All other components within normal limits  CBC - Abnormal; Notable for the following components:   Hemoglobin 9.8 (*)    HCT 33.4 (*)    MCV 75.2 (*)    MCH 22.1 (*)    MCHC 29.3 (*)    All other components within normal limits  URINALYSIS, ROUTINE W REFLEX MICROSCOPIC - Abnormal; Notable for the following components:   Color, Urine YELLOW (*)    APPearance HAZY (*)    Hgb urine dipstick SMALL (*)    Leukocytes,Ua SMALL (*)    Bacteria, UA RARE (*)    All other components within normal limits  CK  POC URINE PREG, ED  CBG MONITORING, ED  TROPONIN I (HIGH SENSITIVITY)  TROPONIN I (HIGH SENSITIVITY)    EKG Sinus rhythm with a rate of 88 bpm.  Normal axis and intervals.  No for signs of acute ischemia.  RADIOLOGY   Official radiology report(s): No results found.  PROCEDURES and INTERVENTIONS:  Procedures  Medications  acetaminophen (TYLENOL) tablet 1,000 mg (1,000 mg Oral Given 02/18/22 0245)  ketorolac (TORADOL) 30 MG/ML injection 15 mg (15 mg Intravenous Given 02/18/22 0247)  droperidol (INAPSINE) 2.5 MG/ML injection 2.5 mg (2.5 mg Intravenous Given 02/18/22 0247)  lactated ringers bolus 1,000 mL (1,000 mLs Intravenous New Bag/Given 02/18/22 0248)     IMPRESSION / MDM / ASSESSMENT AND PLAN / ED COURSE  I reviewed the triage vital signs and the nursing notes.  Differential diagnosis includes, but is not limited to, migraine, complex migraine, stroke, intracranial tumor or bleed  {Patient presents with symptoms of an acute illness or injury that is potentially life-threatening.  41 year old woman presents to the ED with a bad headache with associated paresthesias, possibly complex migraine, but ultimately suitable for outpatient management.  Reassuring examination without signs of neurologic or vascular deficits.  She is reporting paresthesias, but sensation and strength are intact  throughout.  Microcytic anemia is noted and likely chronic.  No metabolic derangements on BMP, CK or troponin.  Symptoms resolved after migraine cocktail and she is suitable for trial of outpatient management.  We discussed return precautions.  Clinical Course as of 02/18/22 0325  Fri Feb 18, 2022  0325 Patient feeling better and requesting discharge. [DS]    Clinical Course User Index [DS] Vladimir Crofts, MD     FINAL CLINICAL IMPRESSION(S) / ED DIAGNOSES   Final diagnoses:  Bad headache     Rx / DC Orders   ED Discharge Orders     None        Note:  This document was prepared using Dragon voice recognition software and may include unintentional dictation errors.   Vladimir Crofts, MD 02/18/22 [phone]

## 2022-02-18 NOTE — ED Notes (Signed)
E-signature pad unavailable - Pt verbalized understanding of D/C information - no additional concerns at this time.   Pt declined repeat VS @ D/C.

## 2022-02-18 NOTE — Discharge Instructions (Signed)
Please take Tylenol and ibuprofen/Advil for your pain.  It is safe to take them together, or to alternate them every few hours.  Take up to 1000mg of Tylenol at a time, up to 4 times per day.  Do not take more than 4000 mg of Tylenol in 24 hours.  For ibuprofen, take 400-600 mg, 3 - 4 times per day.  

## 2022-04-14 ENCOUNTER — Ambulatory Visit: Payer: Self-pay | Admitting: Internal Medicine

## 2022-07-25 IMAGING — CR DG CHEST 2V
1 series · 2 of 2 positions shown · non-contrast
Comparison: 03/17/2020

CLINICAL DATA: Worsening shortness of breath

EXAM:
CHEST - 2 VIEW

[Series 1: dg chest 2 view · 0.14mm/px · 2 of 2 slices shown]
[im 1/2]
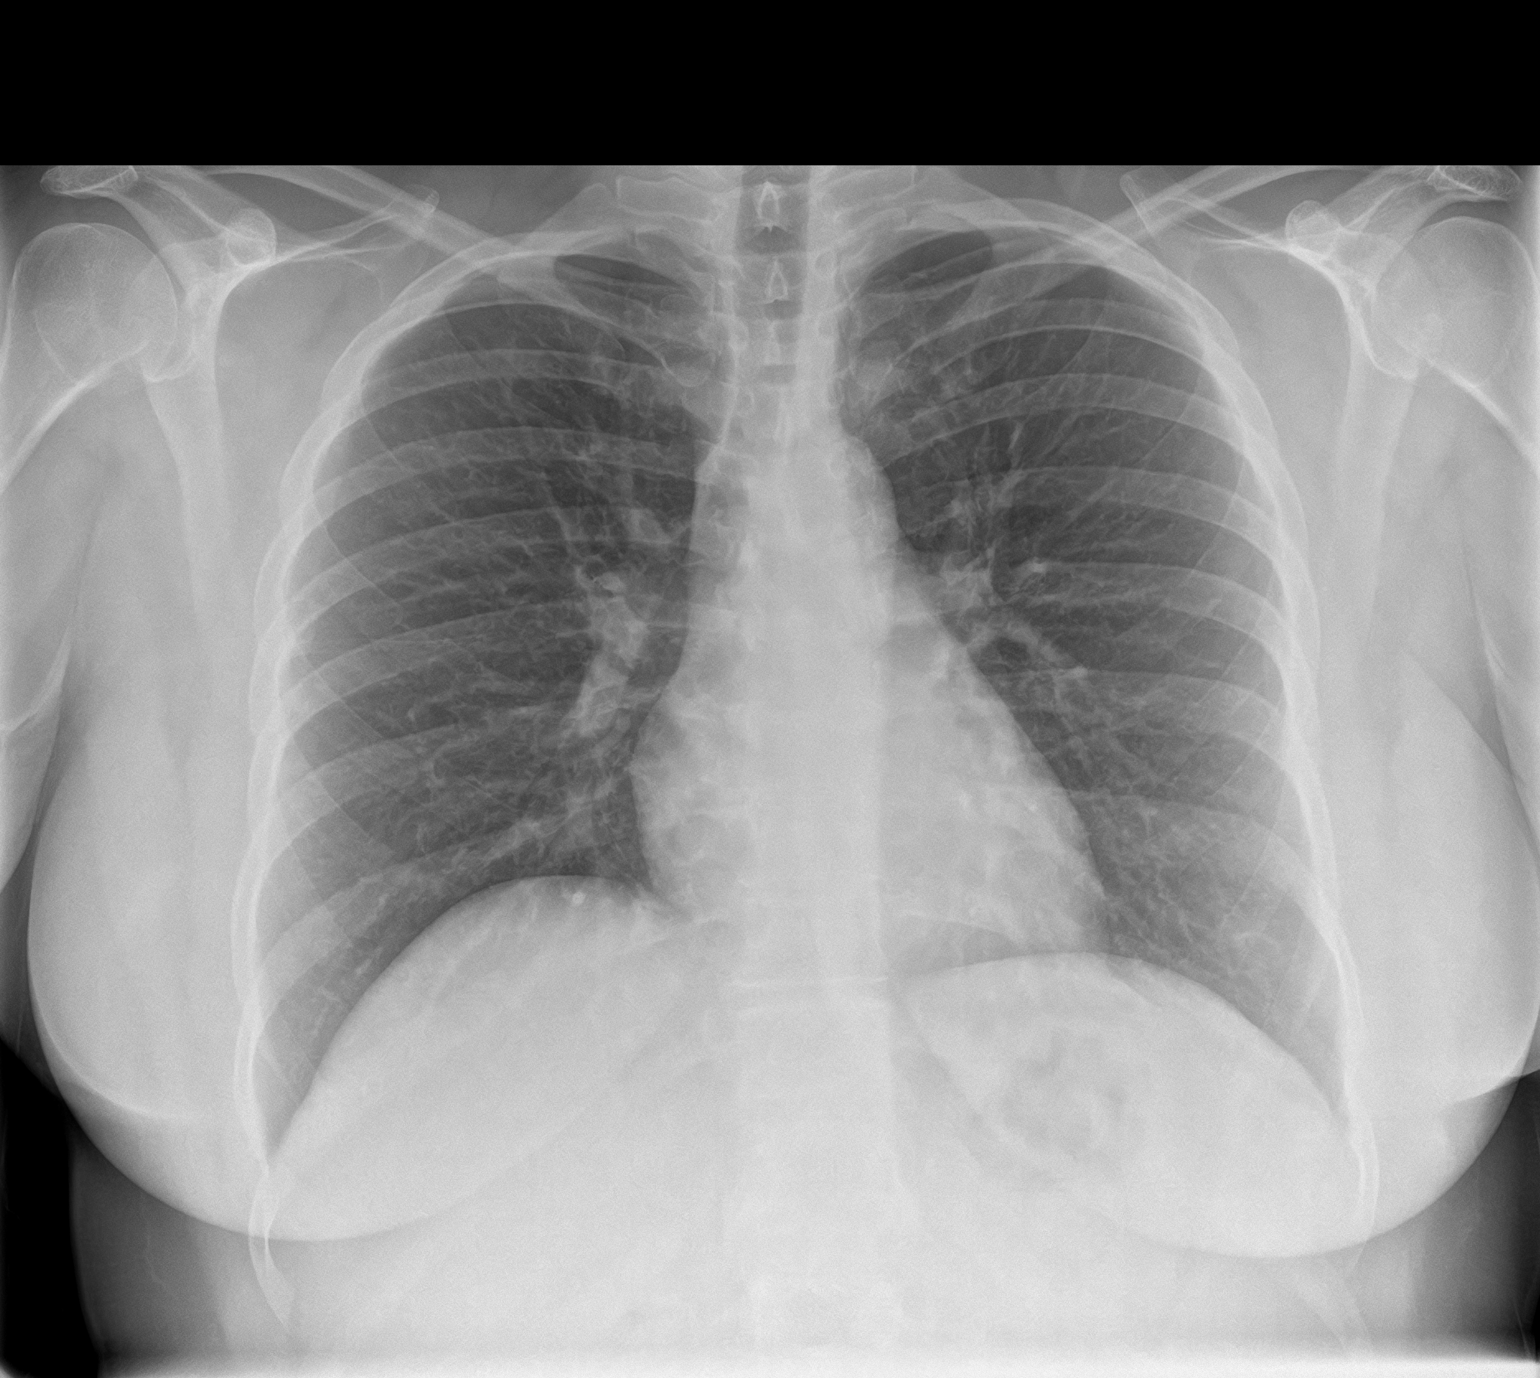
[im 2/2]
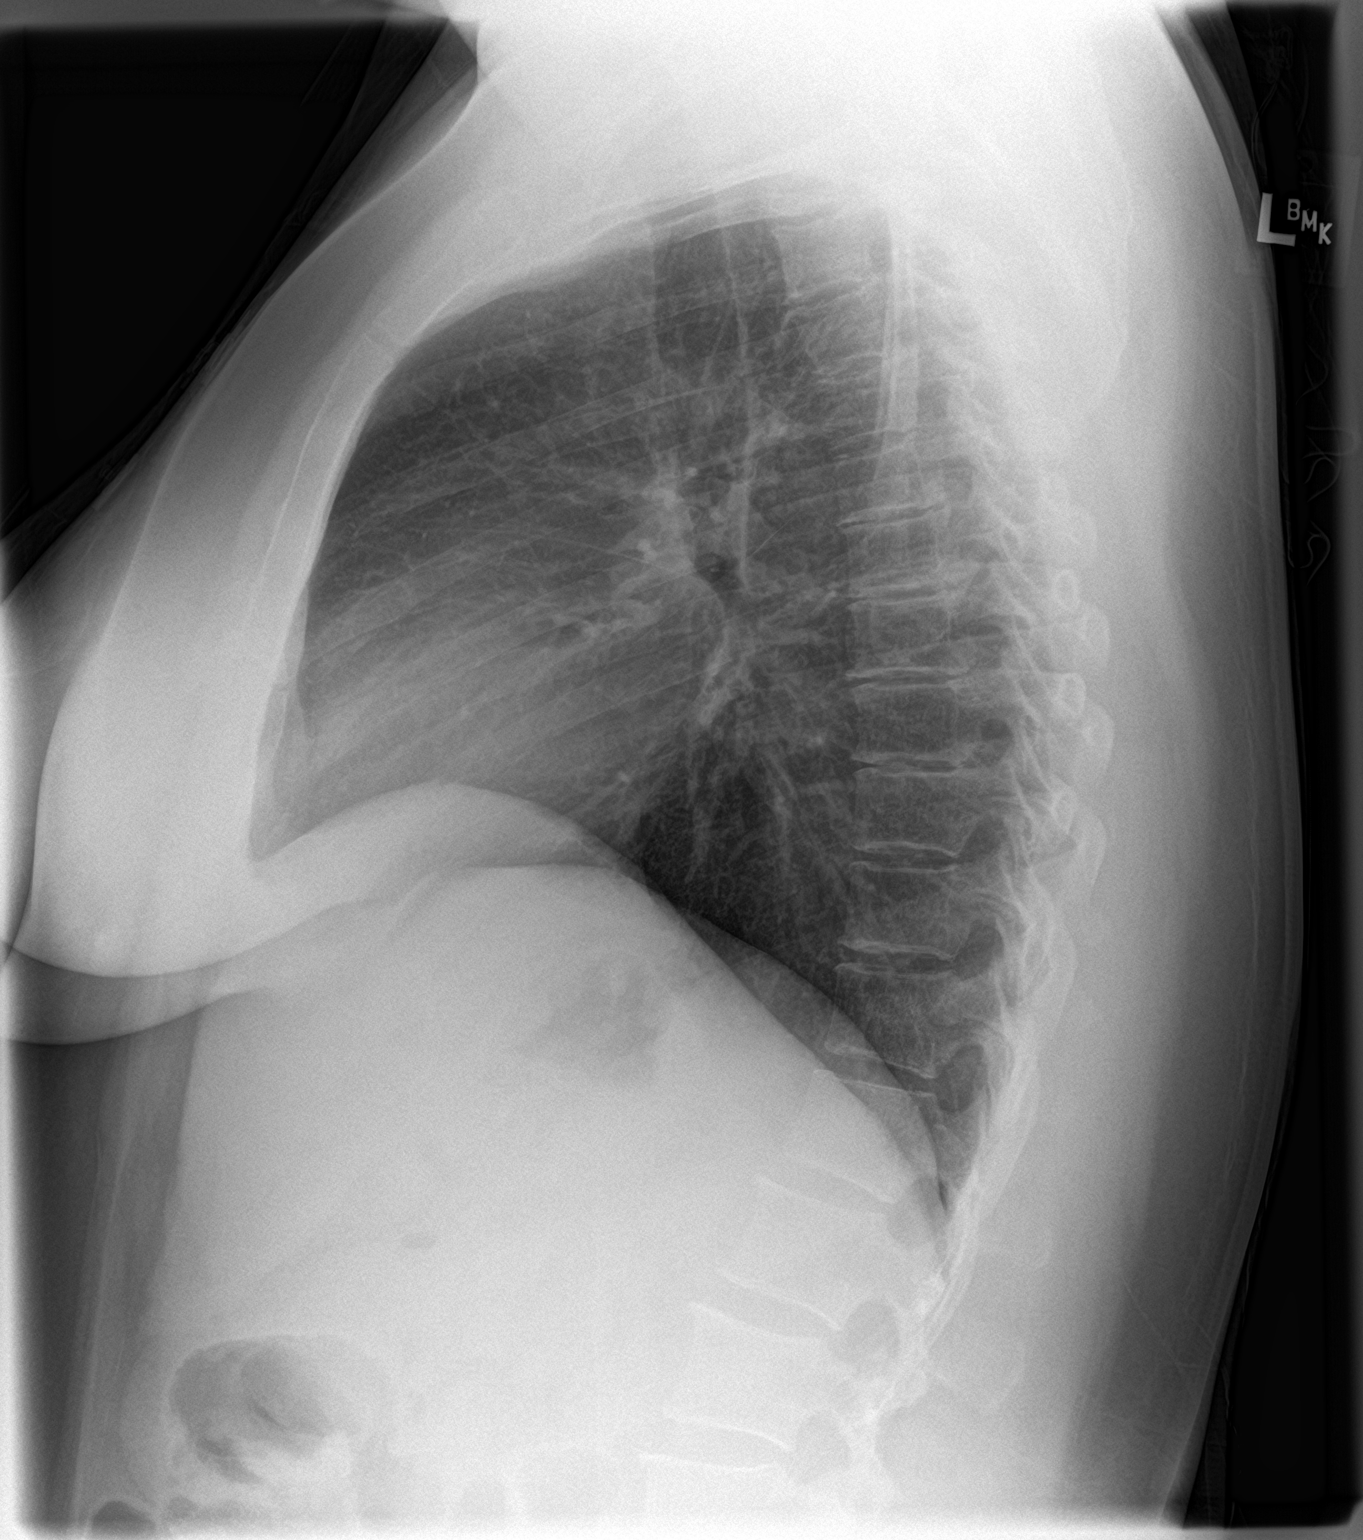

[2 of 2 positions shown; findings below may reference images not displayed]

FINDINGS: The heart size and mediastinal contours are within normal limits.
Both lungs are clear. The visualized skeletal structures are
unremarkable.
IMPRESSION: Negative.

## 2022-08-25 ENCOUNTER — Encounter: Payer: Self-pay | Admitting: Oncology

## 2022-11-01 ENCOUNTER — Ambulatory Visit: Payer: Medicare HMO | Admitting: Psychiatry

## 2022-12-14 ENCOUNTER — Ambulatory Visit: Payer: Medicare HMO | Admitting: Psychiatry

## 2023-02-06 ENCOUNTER — Ambulatory Visit: Payer: Medicare HMO | Admitting: Psychiatry

## 2023-02-14 ENCOUNTER — Encounter: Payer: Self-pay | Admitting: Oncology

## 2023-02-28 ENCOUNTER — Ambulatory Visit (INDEPENDENT_AMBULATORY_CARE_PROVIDER_SITE_OTHER): Payer: 59 | Admitting: Internal Medicine

## 2023-02-28 ENCOUNTER — Ambulatory Visit (INDEPENDENT_AMBULATORY_CARE_PROVIDER_SITE_OTHER): Payer: 59

## 2023-02-28 ENCOUNTER — Encounter: Payer: Self-pay | Admitting: Internal Medicine

## 2023-02-28 VITALS — BP 110/76 | HR 93 | Ht 63.0 in | Wt 193.6 lb

## 2023-02-28 DIAGNOSIS — R0602 Shortness of breath: Secondary | ICD-10-CM

## 2023-02-28 DIAGNOSIS — E559 Vitamin D deficiency, unspecified: Secondary | ICD-10-CM

## 2023-02-28 DIAGNOSIS — Z0130 Encounter for examination of blood pressure without abnormal findings: Secondary | ICD-10-CM

## 2023-02-28 DIAGNOSIS — K219 Gastro-esophageal reflux disease without esophagitis: Secondary | ICD-10-CM | POA: Diagnosis not present

## 2023-02-28 DIAGNOSIS — R5383 Other fatigue: Secondary | ICD-10-CM | POA: Diagnosis not present

## 2023-02-28 DIAGNOSIS — R7303 Prediabetes: Secondary | ICD-10-CM

## 2023-02-28 DIAGNOSIS — F411 Generalized anxiety disorder: Secondary | ICD-10-CM

## 2023-02-28 DIAGNOSIS — D508 Other iron deficiency anemias: Secondary | ICD-10-CM

## 2023-02-28 MED ORDER — ALPRAZOLAM 1 MG PO TABS: 1.0000 mg | ORAL_TABLET | Freq: Every evening | ORAL | 0 refills | Status: DC | PRN

## 2023-02-28 MED ORDER — PANTOPRAZOLE SODIUM 40 MG PO TBEC: 40.0000 mg | DELAYED_RELEASE_TABLET | Freq: Every day | ORAL | 1 refills | Status: DC

## 2023-02-28 NOTE — Progress Notes (Signed)
New Patient Office Visit  Subjective    Patient ID: Abigail Cunningham, female    DOB: 1980-09-02  Age: 42 y.o. MRN: [id number]  CC:  Chief Complaint  Patient presents with   Establish Care    NPE    HPI Abigail Cunningham presents to establish care Previous Primary Care provider/office:   she does have additional concerns to discuss today.   Patient is here to establish PMD.  She has several medical issues as listed.  She is here primarily since she got her insurance and she needs to be treated for her severe anxiety.  Patient has a longstanding psychiatric history and has been treated at inpatient facilities.  She has an appointment coming up with [facility] in 2 days.  Only medicine she is on right now is alprazolam 1 mg for her severe anxiety.  She is almost completely out and requests a few tablets still until she is seen by the psychiatrist at [facility]. She smokes cigarettes regularly and has some shortness of breath occasionally.  Denies chest pain, no cough, no fevers or chills. She also has intermittent heartburn, and feels slight choking sensation at times.     Outpatient Encounter Medications as of 02/28/2023  Medication Sig   albuterol (VENTOLIN HFA) 108 (90 Base) MCG/ACT inhaler Inhale into the lungs every 6 (six) hours as needed for wheezing or shortness of breath.   famotidine (PEPCID) 20 MG tablet Take 1 tablet (20 mg total) by mouth 2 (two) times daily.   pantoprazole (PROTONIX) 40 MG tablet Take 1 tablet (40 mg total) by mouth daily.   [DISCONTINUED] ALPRAZolam (XANAX) 1 MG tablet Take 1 mg by mouth at bedtime as needed for anxiety.   ALPRAZolam (XANAX) 1 MG tablet Take 1 tablet (1 mg total) by mouth at bedtime as needed for anxiety.   [DISCONTINUED] azithromycin (ZITHROMAX Z-PAK) 250 MG tablet 2 pills today then 1 pill a day for 4 days (Patient not taking: Reported on 02/28/2023)   [DISCONTINUED] brompheniramine-pseudoephedrine-DM 30-2-10 MG/5ML syrup Take 5 mLs by  mouth 4 (four) times daily as needed.   [DISCONTINUED] fluconazole (DIFLUCAN) 150 MG tablet Take one now and one in a week   [DISCONTINUED] ibuprofen (ADVIL) 800 MG tablet Take 1 tablet (800 mg total) by mouth every 8 (eight) hours as needed for moderate pain.   [DISCONTINUED] naproxen (NAPROSYN) 500 MG tablet Take 1 tablet (500 mg total) by mouth 2 (two) times daily with a meal.   No facility-administered encounter medications on file as of 02/28/2023.    Past Medical History:  Diagnosis Date   Anemia    Asthma    Borderline personality disorder (HCC)    Bronchitis unk   Chronic hepatitis (HCC)    Generalized OA    Immune deficiency disorder (HCC)    chronic autoimmune hepatitis per pt   Kidney stone    Migraine     Past Surgical History:  Procedure Laterality Date   ABDOMINAL HYSTERECTOMY     LAPAROSCOPIC UNILATERAL SALPINGO OOPHERECTOMY Left 08/15/2019   Procedure: LAPAROSCOPIC UNILATERAL SALPINGO OOPHORECTOMY;  Surgeon: Natale Milch, MD;  Location: [facility] ORS;  Service: Gynecology;  Laterality: Left;   LITHOTRIPSY     OVARIAN CYST REMOVAL      Family History  Adopted: Yes    Social History   Socioeconomic History   Marital status: Married    Spouse name: Not on file   Number of children: Not on file   Years of education: Not  on file   Highest education level: Not on file  Occupational History   Not on file  Tobacco Use   Smoking status: Some Days    Current packs/day: 1.00    Types: Cigarettes   Smokeless tobacco: Never   Tobacco comments:    1 cigarette per day   Vaping Use   Vaping status: Never Used  Substance and Sexual Activity   Alcohol use: No   Drug use: Never   Sexual activity: Not on file  Other Topics Concern   Not on file  Social History Narrative   Not on file   Social Determinants of Health   Financial Resource Strain: Not on file  Food Insecurity: Not on file  Transportation Needs: Not on file  Physical Activity: Not on file   Stress: Not on file  Social Connections: Not on file  Intimate Partner Violence: Not on file    Review of Systems  Constitutional:  Positive for malaise/fatigue. Negative for chills, fever and weight loss.  HENT: Negative.  Negative for congestion, hearing loss, sinus pain and tinnitus.   Eyes: Negative.   Respiratory: Negative.  Negative for cough, shortness of breath and stridor.   Cardiovascular: Negative.  Negative for chest pain, palpitations and leg swelling.  Gastrointestinal: Negative.  Negative for abdominal pain, constipation, diarrhea, heartburn, nausea and vomiting.  Genitourinary: Negative.  Negative for dysuria and flank pain.  Musculoskeletal: Negative.  Negative for joint pain and myalgias.  Skin: Negative.   Neurological: Negative.  Negative for dizziness, tingling, tremors and headaches.  Endo/Heme/Allergies: Negative.   Psychiatric/Behavioral:  Negative for depression and suicidal ideas. The patient is nervous/anxious.         Objective    BP 110/76   Pulse 93   Ht 5\' 3"  (1.6 m)   Wt 193 lb 9.6 oz (87.8 kg)   SpO2 98%   BMI 34.29 kg/m   Physical Exam Vitals and nursing note reviewed.  Constitutional:      Appearance: Normal appearance.  HENT:     Head: Normocephalic and atraumatic.     Nose: Nose normal.     Mouth/Throat:     Mouth: Mucous membranes are moist.     Pharynx: Oropharynx is clear.  Eyes:     Conjunctiva/sclera: Conjunctivae normal.     Pupils: Pupils are equal, round, and reactive to light.  Cardiovascular:     Rate and Rhythm: Normal rate and regular rhythm.     Pulses: Normal pulses.     Heart sounds: Normal heart sounds. No murmur heard. Pulmonary:     Effort: Pulmonary effort is normal.     Breath sounds: Normal breath sounds. No wheezing.  Abdominal:     General: Bowel sounds are normal.     Palpations: Abdomen is soft.     Tenderness: There is no abdominal tenderness. There is no right CVA tenderness or left CVA  tenderness.  Musculoskeletal:        General: Normal range of motion.     Cervical back: Normal range of motion.     Right lower leg: No edema.     Left lower leg: No edema.  Skin:    General: Skin is warm and dry.  Neurological:     General: No focal deficit present.     Mental Status: She is alert and oriented to person, place, and time.  Psychiatric:        Mood and Affect: Mood normal.  Behavior: Behavior normal.        Assessment & Plan:  Check labs today.  Schedule CPE with Pap and breast exam. Will schedule her mammogram at that time. To proceed with psychiatric evaluation at [facility]. Problem List Items Addressed This Visit     GERD (gastroesophageal reflux disease) - Primary   Relevant Medications   pantoprazole (PROTONIX) 40 MG tablet   Iron deficiency anemia   Relevant Orders   CBC with Diff   Ferritin   Iron and IBC ([id number])   Other Visit Diagnoses     Other fatigue       Relevant Orders   Vitamin B12   TSH   Vitamin D deficiency       Relevant Orders   Vitamin D (25 hydroxy)   Prediabetes       Relevant Orders   Hemoglobin A1c   Lipid Panel w/o Chol/HDL Ratio   CMP14+EGFR   Shortness of breath       Relevant Orders   DG Chest 2 View (Completed)   GAD (generalized anxiety disorder)       Relevant Medications   ALPRAZolam (XANAX) 1 MG tablet       Return in about 3 weeks (around 03/21/2023).   Total time spent: 40 minutes  Margaretann Loveless, MD  02/28/2023   This document may have been prepared by [facility] [employer] [facility] Voice Recognition software and as such may include unintentional dictation errors.

## 2023-03-01 ENCOUNTER — Encounter: Payer: Self-pay | Admitting: Internal Medicine

## 2023-03-01 DIAGNOSIS — D508 Other iron deficiency anemias: Secondary | ICD-10-CM

## 2023-03-01 DIAGNOSIS — E559 Vitamin D deficiency, unspecified: Secondary | ICD-10-CM

## 2023-03-01 LAB — CBC WITH DIFFERENTIAL/PLATELET
Basophils Absolute: 0 10*3/uL (ref 0.0–0.2)
Basos: 1 %
EOS (ABSOLUTE): 0.2 10*3/uL (ref 0.0–0.4)
Eos: 4 %
Hematocrit: 31.6 % — ABNORMAL LOW (ref 34.0–46.6)
Hemoglobin: 9 g/dL — ABNORMAL LOW (ref 11.1–15.9)
Immature Grans (Abs): 0 10*3/uL (ref 0.0–0.1)
Immature Granulocytes: 0 %
Lymphocytes Absolute: 2.2 10*3/uL (ref 0.7–3.1)
Lymphs: 35 %
MCH: 21.2 pg — ABNORMAL LOW (ref 26.6–33.0)
MCHC: 28.5 g/dL — ABNORMAL LOW (ref 31.5–35.7)
MCV: 75 fL — ABNORMAL LOW (ref 79–97)
Monocytes Absolute: 0.5 10*3/uL (ref 0.1–0.9)
Monocytes: 8 %
Neutrophils Absolute: 3.3 10*3/uL (ref 1.4–7.0)
Neutrophils: 52 %
Platelets: 183 10*3/uL (ref 150–450)
RBC: 4.24 x10E6/uL (ref 3.77–5.28)
RDW: 16 % — ABNORMAL HIGH (ref 11.7–15.4)
WBC: 6.3 10*3/uL (ref 3.4–10.8)

## 2023-03-01 LAB — LIPID PANEL W/O CHOL/HDL RATIO
Cholesterol, Total: 150 mg/dL (ref 100–199)
HDL: 32 mg/dL — ABNORMAL LOW (ref >39)
LDL Chol Calc (NIH): 90 mg/dL (ref 0–99)
Triglycerides: 162 mg/dL — ABNORMAL HIGH (ref 0–149)
VLDL Cholesterol Cal: 28 mg/dL (ref 5–40)

## 2023-03-01 LAB — HEMOGLOBIN A1C
Est. average glucose Bld gHb Est-mCnc: 126 mg/dL
Hgb A1c MFr Bld: 6 % — ABNORMAL HIGH (ref 4.8–5.6)

## 2023-03-01 LAB — CMP14+EGFR
ALT: 26 [IU]/L (ref 0–32)
AST: 39 [IU]/L (ref 0–40)
Albumin: 4.1 g/dL (ref 3.9–4.9)
Alkaline Phosphatase: 71 [IU]/L (ref 44–121)
BUN/Creatinine Ratio: 8 — ABNORMAL LOW (ref 9–23)
BUN: 6 mg/dL (ref 6–24)
Bilirubin Total: 0.4 mg/dL (ref 0.0–1.2)
CO2: 22 mmol/L (ref 20–29)
Calcium: 9.1 mg/dL (ref 8.7–10.2)
Chloride: 104 mmol/L (ref 96–106)
Creatinine, Ser: 0.75 mg/dL (ref 0.57–1.00)
Globulin, Total: 3.2 g/dL (ref 1.5–4.5)
Glucose: 81 mg/dL (ref 70–99)
Potassium: 4.2 mmol/L (ref 3.5–5.2)
Sodium: 138 mmol/L (ref 134–144)
Total Protein: 7.3 g/dL (ref 6.0–8.5)
eGFR: 101 mL/min/{1.73_m2} (ref >59)

## 2023-03-01 LAB — VITAMIN B12: Vitamin B-12: 355 pg/mL (ref 232–1245)

## 2023-03-01 LAB — IRON AND TIBC
Iron Saturation: 3 % — CL (ref 15–55)
Iron: 15 ug/dL — ABNORMAL LOW (ref 27–159)
Total Iron Binding Capacity: 461 ug/dL — ABNORMAL HIGH (ref 250–450)
UIBC: 446 ug/dL — ABNORMAL HIGH (ref 131–425)

## 2023-03-01 LAB — FERRITIN: Ferritin: 8 ng/mL — ABNORMAL LOW (ref 15–150)

## 2023-03-01 LAB — VITAMIN D 25 HYDROXY (VIT D DEFICIENCY, FRACTURES): Vit D, 25-Hydroxy: 14 ng/mL — ABNORMAL LOW (ref 30.0–100.0)

## 2023-03-01 LAB — TSH: TSH: 1.78 u[IU]/mL (ref 0.450–4.500)

## 2023-03-01 NOTE — Progress Notes (Signed)
Patient notified

## 2023-03-02 MED ORDER — VITAMIN D3 1.25 MG (50000 UT) PO CAPS: 1.0000 | ORAL_CAPSULE | ORAL | 3 refills | Status: AC

## 2023-03-03 ENCOUNTER — Other Ambulatory Visit: Payer: Self-pay

## 2023-03-03 NOTE — Progress Notes (Signed)
Patient Notified via [employer] and Rx was sent

## 2023-03-07 ENCOUNTER — Inpatient Hospital Stay: Payer: 59

## 2023-03-07 ENCOUNTER — Inpatient Hospital Stay: Payer: 59 | Attending: Oncology | Admitting: Oncology

## 2023-03-07 ENCOUNTER — Encounter: Payer: Self-pay | Admitting: Oncology

## 2023-03-14 ENCOUNTER — Other Ambulatory Visit: Payer: Self-pay | Admitting: Internal Medicine

## 2023-03-14 DIAGNOSIS — K219 Gastro-esophageal reflux disease without esophagitis: Secondary | ICD-10-CM

## 2023-03-14 MED ORDER — PANTOPRAZOLE SODIUM 40 MG PO TBEC: 40.0000 mg | DELAYED_RELEASE_TABLET | Freq: Every day | ORAL | 1 refills | Status: DC

## 2023-03-21 ENCOUNTER — Encounter: Payer: Self-pay | Admitting: Oncology

## 2023-03-21 ENCOUNTER — Encounter: Payer: 59 | Admitting: Internal Medicine

## 2023-04-05 ENCOUNTER — Ambulatory Visit (INDEPENDENT_AMBULATORY_CARE_PROVIDER_SITE_OTHER): Payer: 59 | Admitting: Psychiatry

## 2023-04-05 ENCOUNTER — Encounter: Payer: Self-pay | Admitting: Psychiatry

## 2023-04-05 VITALS — BP 119/80 | HR 98 | Temp 96.8°F | Ht 63.0 in | Wt 195.4 lb

## 2023-04-05 DIAGNOSIS — F1290 Cannabis use, unspecified, uncomplicated: Secondary | ICD-10-CM | POA: Insufficient documentation

## 2023-04-05 DIAGNOSIS — Z79899 Other long term (current) drug therapy: Secondary | ICD-10-CM

## 2023-04-05 DIAGNOSIS — F4010 Social phobia, unspecified: Secondary | ICD-10-CM

## 2023-04-05 DIAGNOSIS — Z8659 Personal history of other mental and behavioral disorders: Secondary | ICD-10-CM

## 2023-04-05 DIAGNOSIS — F3162 Bipolar disorder, current episode mixed, moderate: Secondary | ICD-10-CM | POA: Insufficient documentation

## 2023-04-05 DIAGNOSIS — F419 Anxiety disorder, unspecified: Secondary | ICD-10-CM | POA: Insufficient documentation

## 2023-04-05 DIAGNOSIS — F4310 Post-traumatic stress disorder, unspecified: Secondary | ICD-10-CM | POA: Diagnosis not present

## 2023-04-05 MED ORDER — DIVALPROEX SODIUM ER 250 MG PO TB24: 250.0000 mg | ORAL_TABLET | Freq: Every day | ORAL | 0 refills | Status: AC

## 2023-04-05 MED ORDER — DIVALPROEX SODIUM ER 500 MG PO TB24: 500.0000 mg | ORAL_TABLET | Freq: Every day | ORAL | 0 refills | Status: AC

## 2023-04-05 MED ORDER — HYDROXYZINE PAMOATE 25 MG PO CAPS: 25.0000 mg | ORAL_CAPSULE | Freq: Two times a day (BID) | ORAL | 0 refills | Status: AC | PRN
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
## 2023-04-05 NOTE — Patient Instructions (Signed)
Hydroxyzine Capsules or Tablets What is this medication? HYDROXYZINE (hye DROX i zeen) treats the symptoms of allergies and allergic reactions. It may also be used to treat anxiety or cause drowsiness before a procedure. It works by blocking histamine, a substance released by the body during an allergic reaction. It belongs to a group of medications called antihistamines. This medicine may be used for other purposes; ask your health care provider or pharmacist if you have questions. COMMON BRAND NAME(S): ANX, Atarax, Rezine, Vistaril What should I tell my care team before I take this medication? They need to know if you have any of these conditions: Glaucoma Heart disease Irregular heartbeat or rhythm Kidney disease Liver disease Lung or breathing disease, such as asthma Stomach or intestine problems Thyroid disease Trouble passing urine An unusual or allergic reaction to hydroxyzine, other medications, foods, dyes or preservatives Pregnant or trying to get pregnant Breastfeeding How should I use this medication? Take this medication by mouth with a full glass of water. Take it as directed on the prescription label at the same time every day. You can take it with or without food. If it upsets your stomach, take it with food. Talk to your care team about the use of this medication in children. While it may be prescribed for children as young as 6 years for selected conditions, precautions do apply. People 65 years and older may have a stronger reaction and need a smaller dose. Overdosage: If you think you have taken too much of this medicine contact a poison control center or emergency room at once. NOTE: This medicine is only for you. Do not share this medicine with others. What if I miss a dose? If you miss a dose, take it as soon as you can. If it is almost time for your next dose, take only that dose. Do not take double or extra doses. What may interact with this medication? Do not  take this medication with any of the following: Cisapride Dronedarone Pimozide Thioridazine This medication may also interact with the following: Alcohol Antihistamines for allergy, cough, and cold Atropine Barbiturate medications for sleep or seizures, such as phenobarbital Certain antibiotics, such as erythromycin or clarithromycin Certain medications for anxiety or sleep Certain medications for bladder problems, such as oxybutynin or tolterodine Certain medications for irregular heartbeat Certain medications for mental health conditions Certain medications for Parkinson disease, such as benztropine, trihexyphenidyl Certain medications for seizures, such as phenobarbital or primidone Certain medications for stomach problems, such as dicyclomine or hyoscyamine Certain medications for travel sickness, such as scopolamine Ipratropium Opioid medications for pain Other medications that cause heart rhythm changes, such as dofetilide This list may not describe all possible interactions. Give your health care provider a list of all the medicines, herbs, non-prescription drugs, or dietary supplements you use. Also tell them if you smoke, drink alcohol, or use illegal drugs. Some items may interact with your medicine. What should I watch for while using this medication? Visit your care team for regular checks on your progress. Tell your care team if your symptoms do not start to get better or if they get worse. This medication may affect your coordination, reaction time, or judgment. Do not drive or operate machinery until you know how this medication affects you. Sit up or stand slowly to reduce the risk of dizzy or fainting spells. Drinking alcohol with this medication can increase the risk of these side effects. Your mouth may get dry. Chewing sugarless gum or sucking hard candy   and drinking plenty of water may help. Contact your care team if the problem does not go away or is severe. This  medication may cause dry eyes and blurred vision. If you wear contact lenses, you may feel some discomfort. Lubricating eye drops may help. See your care team if the problem does not go away or is severe. If you are receiving skin tests for allergies, tell your care team you are taking this medication. What side effects may I notice from receiving this medication? Side effects that you should report to your care team as soon as possible: Allergic reactions--skin rash, itching, hives, swelling of the face, lips, tongue, or throat Heart rhythm changes--fast or irregular heartbeat, dizziness, feeling faint or lightheaded, chest pain, trouble breathing Side effects that usually do not require medical attention (report to your care team if they continue or are bothersome): Confusion Drowsiness Dry mouth Hallucinations Headache This list may not describe all possible side effects. Call your doctor for medical advice about side effects. You may report side effects to FDA at 1-800-FDA-1088. Where should I keep my medication? Keep out of the reach of children and pets. Store at room temperature between 15 and 30 degrees C (59 and 86 degrees F). Keep container tightly closed. Throw away any unused medication after the expiration date. NOTE: This sheet is a summary. It may not cover all possible information. If you have questions about this medicine, talk to your doctor, pharmacist, or health care provider.  2024 Elsevier/Gold Standard (2021-12-10 00:00:00)  

## 2023-04-24 ENCOUNTER — Ambulatory Visit (INDEPENDENT_AMBULATORY_CARE_PROVIDER_SITE_OTHER): Payer: Self-pay | Admitting: Licensed Clinical Social Worker

## 2023-04-24 DIAGNOSIS — Z91199 Patient's noncompliance with other medical treatment and regimen due to unspecified reason: Secondary | ICD-10-CM

## 2023-04-24 NOTE — Progress Notes (Signed)
Clinician attempted session via face-to-face, but Abigail Cunningham did not appear for her session.

## 2023-05-29 ENCOUNTER — Other Ambulatory Visit: Payer: Self-pay | Admitting: Internal Medicine

## 2023-05-29 DIAGNOSIS — K219 Gastro-esophageal reflux disease without esophagitis: Secondary | ICD-10-CM

## 2023-05-31 ENCOUNTER — Ambulatory Visit: Payer: 59 | Admitting: Psychiatry
